# Patient Record
Sex: Female | Born: 1935 | Race: White | Hispanic: No | Marital: Married | Smoking: Never smoker
Health system: Southern US, Community
[De-identification: ages and names within clinical notes are randomized; demographics above are authoritative.]

## PROBLEM LIST (undated history)

## (undated) DIAGNOSIS — E785 Hyperlipidemia, unspecified: Secondary | ICD-10-CM

## (undated) DIAGNOSIS — E079 Disorder of thyroid, unspecified: Secondary | ICD-10-CM

## (undated) DIAGNOSIS — I10 Essential (primary) hypertension: Secondary | ICD-10-CM

## (undated) HISTORY — DX: Disorder of thyroid, unspecified: E07.9

## (undated) HISTORY — DX: Essential (primary) hypertension: I10

## (undated) HISTORY — DX: Hyperlipidemia, unspecified: E78.5

---

## 2005-10-15 ENCOUNTER — Other Ambulatory Visit: Admission: RE | Admit: 2005-10-15 | Discharge: 2005-10-15 | Payer: Self-pay | Admitting: Family Medicine

## 2006-04-18 ENCOUNTER — Ambulatory Visit: Payer: Self-pay | Admitting: Internal Medicine

## 2006-05-22 ENCOUNTER — Encounter (INDEPENDENT_AMBULATORY_CARE_PROVIDER_SITE_OTHER): Payer: Self-pay | Admitting: Specialist

## 2006-05-22 ENCOUNTER — Ambulatory Visit: Payer: Self-pay | Admitting: Internal Medicine

## 2006-08-11 ENCOUNTER — Ambulatory Visit: Payer: Self-pay | Admitting: Internal Medicine

## 2010-11-15 ENCOUNTER — Other Ambulatory Visit
Admission: RE | Admit: 2010-11-15 | Discharge: 2010-11-15 | Payer: Self-pay | Source: Home / Self Care | Admitting: Geriatric Medicine

## 2010-12-12 ENCOUNTER — Encounter
Admission: RE | Admit: 2010-12-12 | Discharge: 2010-12-12 | Payer: Self-pay | Source: Home / Self Care | Attending: Endocrinology | Admitting: Endocrinology

## 2010-12-23 ENCOUNTER — Encounter: Payer: Self-pay | Admitting: Endocrinology

## 2011-04-19 NOTE — Assessment & Plan Note (Signed)
[facility]                           GASTROENTEROLOGY OFFICE NOTE   NAME:ETCHEVERRYFraida, [name]                     MRN:          [id number]  DATE:08/11/2006                            DOB:          11-Feb-1932    REASON FOR CONSULTATION:  Rectal bleeding.   HISTORY:  This is a 77 year old female who was evaluated in this office  initially Apr 18, 2006 for reflux disease, history of adenomatous colon  polyps, and to establish GI care.  See that dictation for details.  She  presents today with minor rectal bleeding.  About 4 weeks ago, she noticed  some bright red blood on the tissue after defecating.  She has also noticed  a drop or 2 in the commode.  There has been no associated abdominal pain.  No associated rectal pain.  She does tell me that she has a history of  hemorrhoids.  She has had multiple colonoscopies; the last of which was 2005  and was essentially normal.  She is otherwise well but just wished to have  this problem evaluated.  She has had no bleeding in several weeks.   MEDICATIONS:  Per medication list, unchanged from previous.   PHYSICAL EXAMINATION:  GENERAL:  Well-appearing female in no acute distress.  VITAL SIGNS:  Blood pressure 140/88.  Heart rate is 88, regular.  Weight is  173.6 pounds (increased 2 pounds).  ABDOMEN:  Obese and soft without tenderness, mass, or hernia.  RECTAL:  Reveals no external abnormalities. No internal tenderness or mass.  Stool is Hemoccult negative.  Anoscopy was performed and revealed internal  hemorrhoids.  No other problems.   IMPRESSION:  1. Prior problems with minor rectal bleeding due to internal hemorrhoids.  2. History of colon polyps with multiple surveillance colonoscopies, last      of which in 2005 was negative.  3. History of reflux disease, asymptomatic, on omeprazole.   RECOMMENDATION:  At this point, the patient has been reassured.  I do not  think she needs any further  investigations at present.  I did give her a  brochure on hemorrhoids and suggested that if she has further problems to  consider fiber therapy.  She was satisfied but will return to the general  medical care of Dr. Smith Mince but knows to contact this office for questions  or problems.                                  Wilhemina Bonito. Shelley [name]., MD   JNP/MedQ  DD:  08/11/2006  DT:  08/11/2006  Job #:  [id number]   cc:   Talmadge Coventry, M.D.

## 2011-12-18 DIAGNOSIS — Z79899 Other long term (current) drug therapy: Secondary | ICD-10-CM | POA: Diagnosis not present

## 2011-12-18 DIAGNOSIS — E782 Mixed hyperlipidemia: Secondary | ICD-10-CM | POA: Diagnosis not present

## 2011-12-18 DIAGNOSIS — E039 Hypothyroidism, unspecified: Secondary | ICD-10-CM | POA: Diagnosis not present

## 2011-12-18 DIAGNOSIS — D7589 Other specified diseases of blood and blood-forming organs: Secondary | ICD-10-CM | POA: Diagnosis not present

## 2011-12-18 DIAGNOSIS — K219 Gastro-esophageal reflux disease without esophagitis: Secondary | ICD-10-CM | POA: Diagnosis not present

## 2011-12-18 DIAGNOSIS — I10 Essential (primary) hypertension: Secondary | ICD-10-CM | POA: Diagnosis not present

## 2012-01-06 DIAGNOSIS — J40 Bronchitis, not specified as acute or chronic: Secondary | ICD-10-CM | POA: Diagnosis not present

## 2012-03-24 DIAGNOSIS — R197 Diarrhea, unspecified: Secondary | ICD-10-CM | POA: Diagnosis not present

## 2012-04-15 DIAGNOSIS — I10 Essential (primary) hypertension: Secondary | ICD-10-CM | POA: Diagnosis not present

## 2012-04-15 DIAGNOSIS — N898 Other specified noninflammatory disorders of vagina: Secondary | ICD-10-CM | POA: Diagnosis not present

## 2012-04-15 DIAGNOSIS — M899 Disorder of bone, unspecified: Secondary | ICD-10-CM | POA: Diagnosis not present

## 2012-04-15 DIAGNOSIS — N649 Disorder of breast, unspecified: Secondary | ICD-10-CM | POA: Diagnosis not present

## 2012-06-03 DIAGNOSIS — M899 Disorder of bone, unspecified: Secondary | ICD-10-CM | POA: Diagnosis not present

## 2012-06-03 DIAGNOSIS — M949 Disorder of cartilage, unspecified: Secondary | ICD-10-CM | POA: Diagnosis not present

## 2012-07-28 DIAGNOSIS — Z1231 Encounter for screening mammogram for malignant neoplasm of breast: Secondary | ICD-10-CM | POA: Diagnosis not present

## 2012-07-29 DIAGNOSIS — Z1231 Encounter for screening mammogram for malignant neoplasm of breast: Secondary | ICD-10-CM | POA: Diagnosis not present

## 2012-07-29 DIAGNOSIS — N6489 Other specified disorders of breast: Secondary | ICD-10-CM | POA: Diagnosis not present

## 2012-08-05 DIAGNOSIS — H2510 Age-related nuclear cataract, unspecified eye: Secondary | ICD-10-CM | POA: Diagnosis not present

## 2012-08-26 DIAGNOSIS — I129 Hypertensive chronic kidney disease with stage 1 through stage 4 chronic kidney disease, or unspecified chronic kidney disease: Secondary | ICD-10-CM | POA: Diagnosis not present

## 2012-08-26 DIAGNOSIS — I10 Essential (primary) hypertension: Secondary | ICD-10-CM | POA: Diagnosis not present

## 2012-08-26 DIAGNOSIS — Z23 Encounter for immunization: Secondary | ICD-10-CM | POA: Diagnosis not present

## 2012-08-26 DIAGNOSIS — Z79899 Other long term (current) drug therapy: Secondary | ICD-10-CM | POA: Diagnosis not present

## 2012-08-26 DIAGNOSIS — E782 Mixed hyperlipidemia: Secondary | ICD-10-CM | POA: Diagnosis not present

## 2012-09-16 DIAGNOSIS — L821 Other seborrheic keratosis: Secondary | ICD-10-CM | POA: Diagnosis not present

## 2012-09-16 DIAGNOSIS — L570 Actinic keratosis: Secondary | ICD-10-CM | POA: Diagnosis not present

## 2012-09-16 DIAGNOSIS — Z85828 Personal history of other malignant neoplasm of skin: Secondary | ICD-10-CM | POA: Diagnosis not present

## 2012-09-21 DIAGNOSIS — I10 Essential (primary) hypertension: Secondary | ICD-10-CM | POA: Diagnosis not present

## 2012-09-28 DIAGNOSIS — I129 Hypertensive chronic kidney disease with stage 1 through stage 4 chronic kidney disease, or unspecified chronic kidney disease: Secondary | ICD-10-CM | POA: Diagnosis not present

## 2012-10-21 DIAGNOSIS — I10 Essential (primary) hypertension: Secondary | ICD-10-CM | POA: Diagnosis not present

## 2012-10-21 DIAGNOSIS — E782 Mixed hyperlipidemia: Secondary | ICD-10-CM | POA: Diagnosis not present

## 2012-11-03 DIAGNOSIS — E040 Nontoxic diffuse goiter: Secondary | ICD-10-CM | POA: Diagnosis not present

## 2012-11-03 DIAGNOSIS — E039 Hypothyroidism, unspecified: Secondary | ICD-10-CM | POA: Diagnosis not present

## 2012-12-29 DIAGNOSIS — Z0000 Encounter for general adult medical examination without abnormal findings: Secondary | ICD-10-CM | POA: Diagnosis not present

## 2012-12-29 DIAGNOSIS — I129 Hypertensive chronic kidney disease with stage 1 through stage 4 chronic kidney disease, or unspecified chronic kidney disease: Secondary | ICD-10-CM | POA: Diagnosis not present

## 2012-12-29 DIAGNOSIS — Z1331 Encounter for screening for depression: Secondary | ICD-10-CM | POA: Diagnosis not present

## 2012-12-29 DIAGNOSIS — Z79899 Other long term (current) drug therapy: Secondary | ICD-10-CM | POA: Diagnosis not present

## 2013-02-04 ENCOUNTER — Other Ambulatory Visit: Payer: Self-pay | Admitting: Endocrinology

## 2013-02-04 DIAGNOSIS — E049 Nontoxic goiter, unspecified: Secondary | ICD-10-CM

## 2013-03-30 DIAGNOSIS — I10 Essential (primary) hypertension: Secondary | ICD-10-CM | POA: Diagnosis not present

## 2013-03-30 DIAGNOSIS — E782 Mixed hyperlipidemia: Secondary | ICD-10-CM | POA: Diagnosis not present

## 2013-04-28 DIAGNOSIS — N898 Other specified noninflammatory disorders of vagina: Secondary | ICD-10-CM | POA: Diagnosis not present

## 2013-04-28 DIAGNOSIS — Z01419 Encounter for gynecological examination (general) (routine) without abnormal findings: Secondary | ICD-10-CM | POA: Diagnosis not present

## 2013-06-04 DIAGNOSIS — W57XXXA Bitten or stung by nonvenomous insect and other nonvenomous arthropods, initial encounter: Secondary | ICD-10-CM | POA: Diagnosis not present

## 2013-07-07 DIAGNOSIS — M25579 Pain in unspecified ankle and joints of unspecified foot: Secondary | ICD-10-CM | POA: Diagnosis not present

## 2013-07-07 DIAGNOSIS — M1710 Unilateral primary osteoarthritis, unspecified knee: Secondary | ICD-10-CM | POA: Diagnosis not present

## 2013-07-15 DIAGNOSIS — M1710 Unilateral primary osteoarthritis, unspecified knee: Secondary | ICD-10-CM | POA: Diagnosis not present

## 2013-07-29 DIAGNOSIS — Z803 Family history of malignant neoplasm of breast: Secondary | ICD-10-CM | POA: Diagnosis not present

## 2013-07-29 DIAGNOSIS — Z1231 Encounter for screening mammogram for malignant neoplasm of breast: Secondary | ICD-10-CM | POA: Diagnosis not present

## 2013-08-17 DIAGNOSIS — H02829 Cysts of unspecified eye, unspecified eyelid: Secondary | ICD-10-CM | POA: Diagnosis not present

## 2013-08-17 DIAGNOSIS — H2510 Age-related nuclear cataract, unspecified eye: Secondary | ICD-10-CM | POA: Diagnosis not present

## 2013-09-17 DIAGNOSIS — Z23 Encounter for immunization: Secondary | ICD-10-CM | POA: Diagnosis not present

## 2013-09-28 DIAGNOSIS — I129 Hypertensive chronic kidney disease with stage 1 through stage 4 chronic kidney disease, or unspecified chronic kidney disease: Secondary | ICD-10-CM | POA: Diagnosis not present

## 2013-09-28 DIAGNOSIS — E782 Mixed hyperlipidemia: Secondary | ICD-10-CM | POA: Diagnosis not present

## 2013-09-28 DIAGNOSIS — Z79899 Other long term (current) drug therapy: Secondary | ICD-10-CM | POA: Diagnosis not present

## 2013-09-28 DIAGNOSIS — E039 Hypothyroidism, unspecified: Secondary | ICD-10-CM | POA: Diagnosis not present

## 2013-09-28 DIAGNOSIS — Z1331 Encounter for screening for depression: Secondary | ICD-10-CM | POA: Diagnosis not present

## 2013-10-25 ENCOUNTER — Ambulatory Visit
Admission: RE | Admit: 2013-10-25 | Discharge: 2013-10-25 | Disposition: A | Discharge status: Home / Self Care | Payer: Medicare Other | Source: Ambulatory Visit | Attending: Endocrinology | Admitting: Endocrinology

## 2013-10-25 DIAGNOSIS — E049 Nontoxic goiter, unspecified: Secondary | ICD-10-CM

## 2014-05-06 IMAGING — US US SOFT TISSUE HEAD/NECK
1 series · 14 of 25 positions shown · non-contrast
Comparison: 12/12/2010 and earlier studies

CLINICAL DATA: Goiter

EXAM:
THYROID ULTRASOUND
TECHNIQUE: Ultrasound examination of the thyroid gland and adjacent soft
tissues was performed.

[Series 1: us soft tissue head/neck · 0.06mm/px · 14 of 43 slices shown]
[im 1/43]
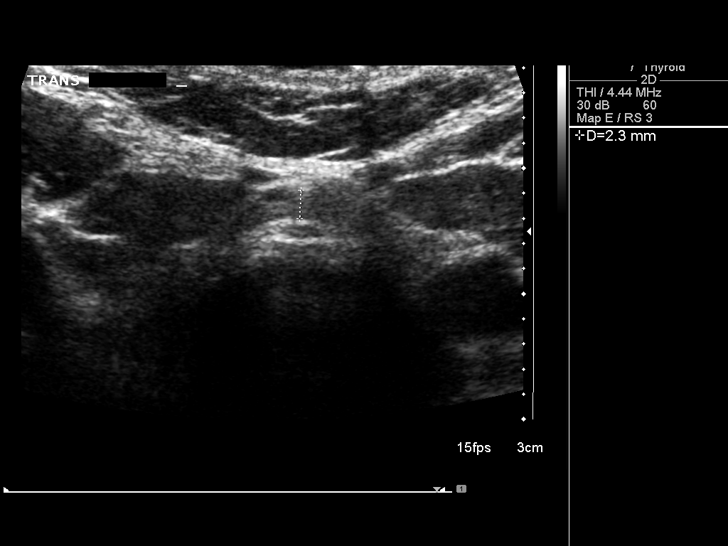
[im 4/43]
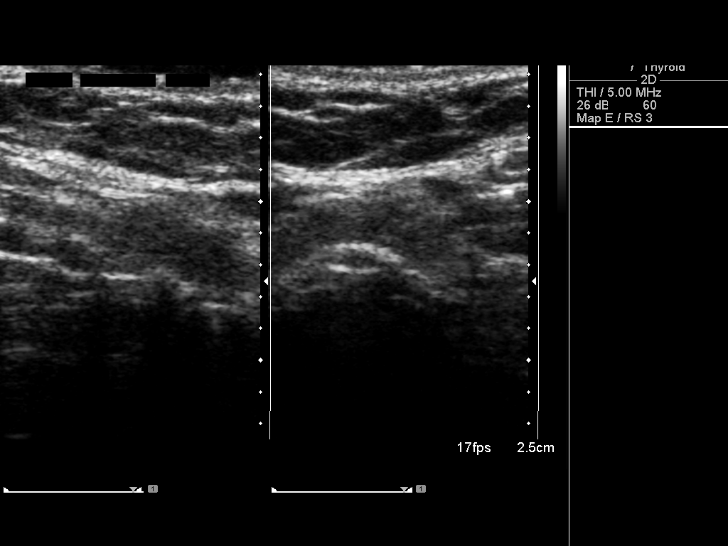
[im 8/43]
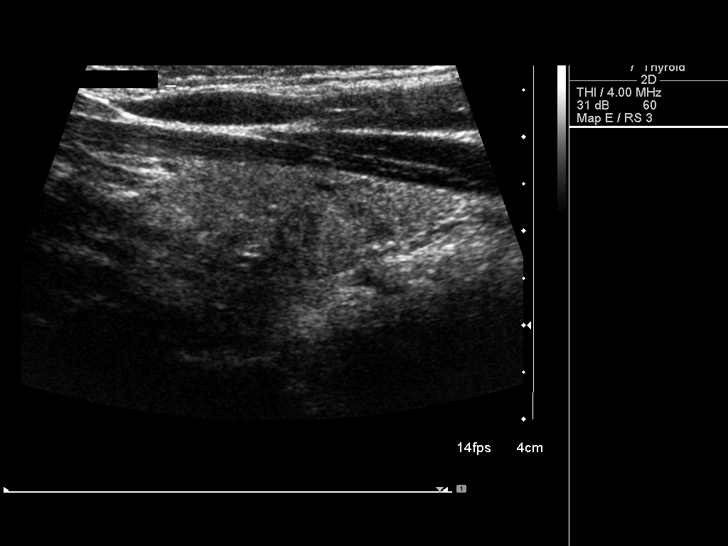
[im 11/43]
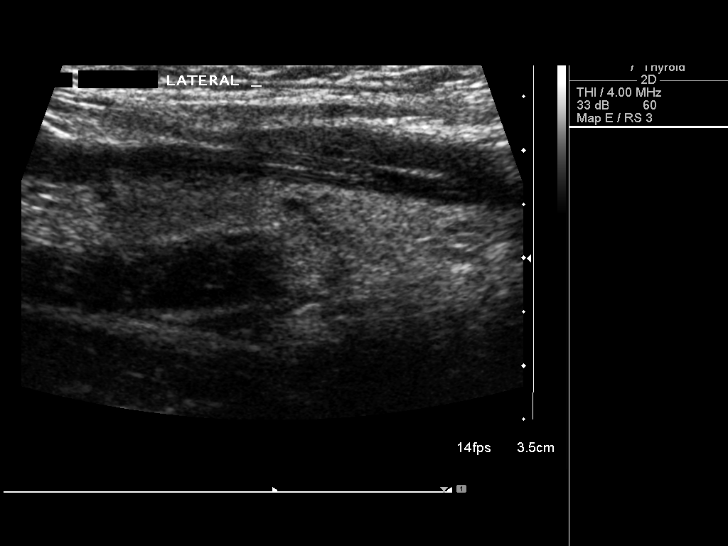
[im 15/43]
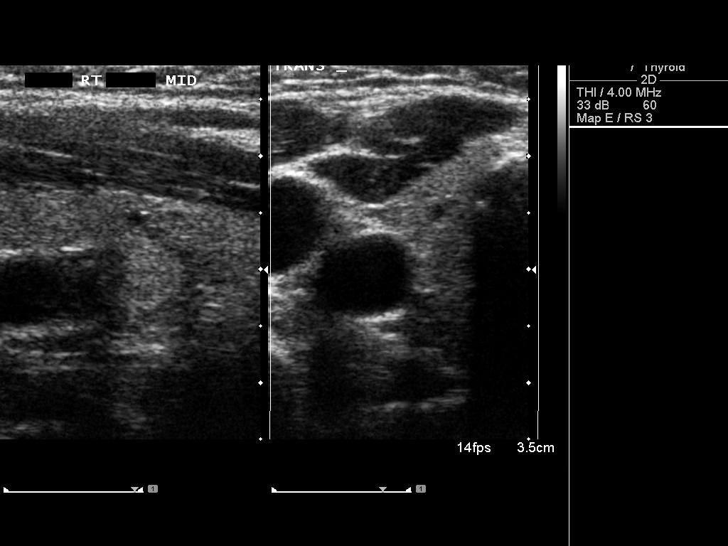
[im 16/43]
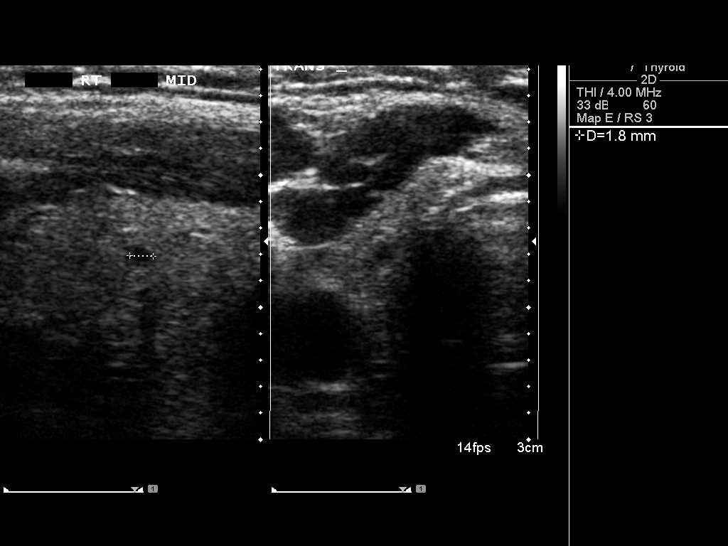
[im 20/43]
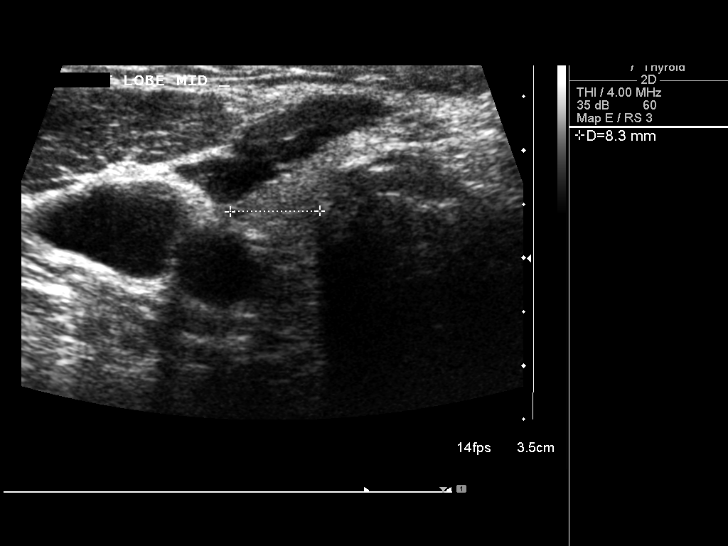
[im 23/43]
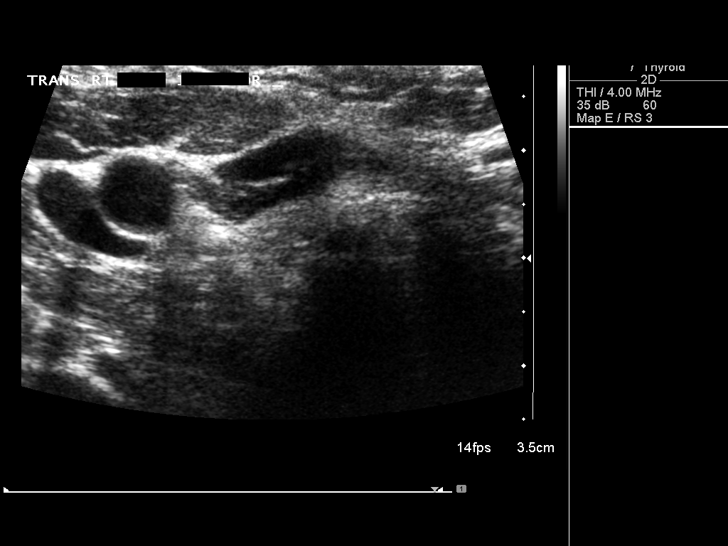
[im 27/43]
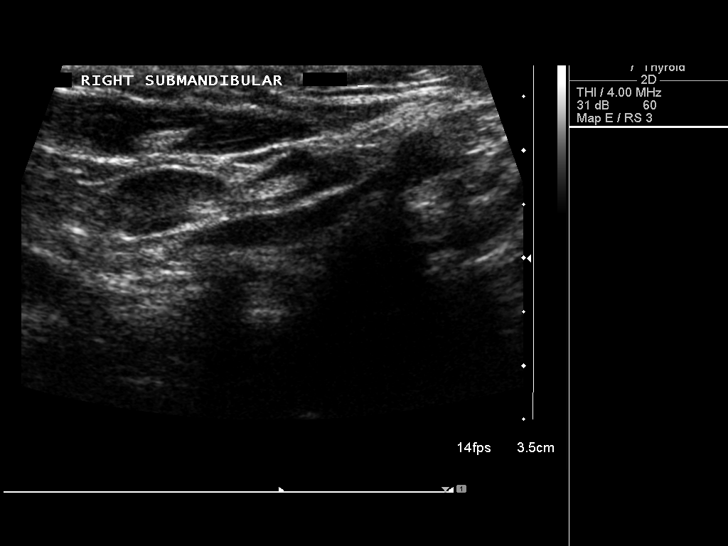
[im 29/43]
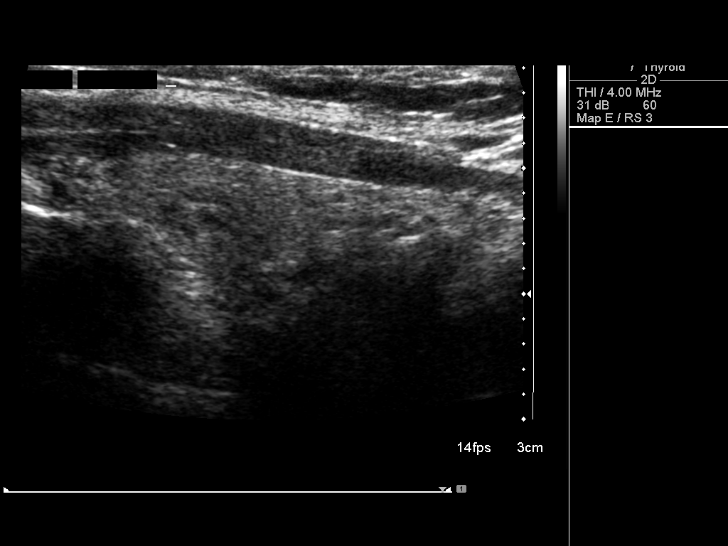
[im 32/43]
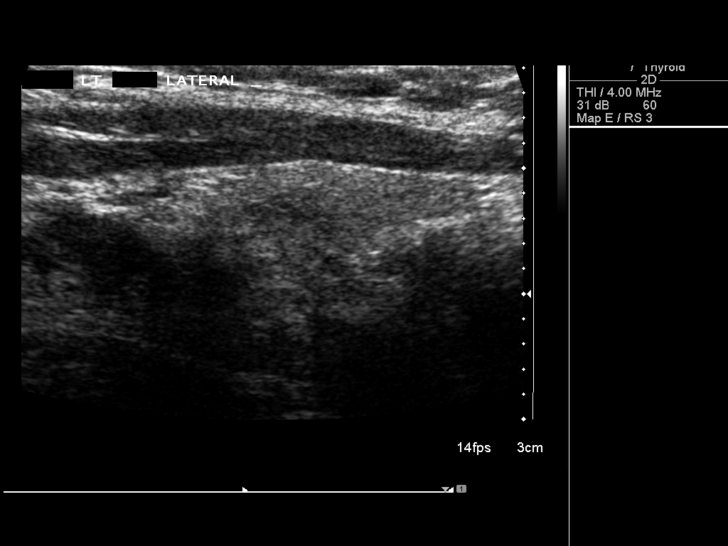
[im 36/43]
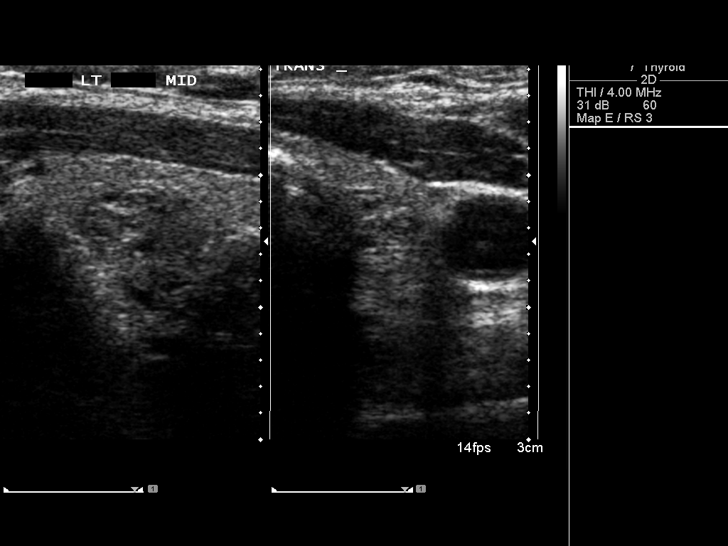
[im 39/43]
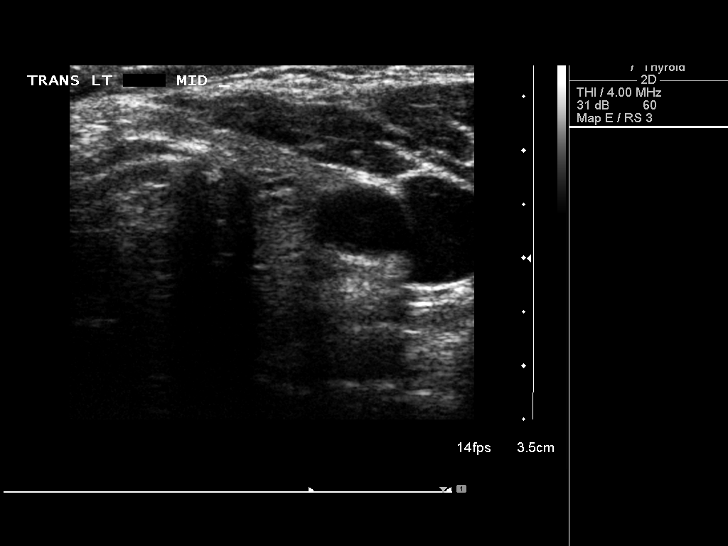
[im 43/43]
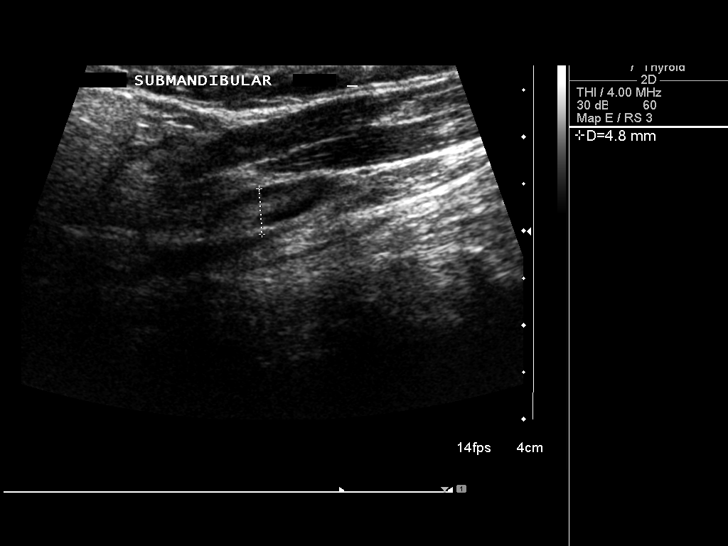

[14 of 25 positions shown; findings below may reference images not displayed]

FINDINGS: Right thyroid lobe

Measurements: 34 x 13 x 8 mm. Inhomogeneous with 2 small cystic
areas measuring 2 mm or less.

Left thyroid lobe

Measurements: 28 x 12 x 9 mm.  Inhomogeneous and hyperemic.

7 x 6 x 6 mm solid nodule, mid lobe

Isthmus

Thickness: 2 mm. Poorly marginated 8 x 4 x 5 mm hypoechoic nodule
left of midline.

Lymphadenopathy

None visualized.
IMPRESSION: Normal-sized thyroid with small nodules as above. Findings do not
meet current consensus criteria for biopsy. Follow-up by clinical
exam is recommended. If patient has known risk factors for thyroid
carcinoma, consider follow-up ultrasound in 12 months. If patient is
clinically hyperthyroid, consider nuclear medicine thyroid uptake
and scan. This recommendation follows the consensus statement:
Management of Thyroid Nodules Detected as US: Society of
Radiologists in Ultrasound Consensus Conference Statement. Radiology

## 2014-05-10 DIAGNOSIS — N898 Other specified noninflammatory disorders of vagina: Secondary | ICD-10-CM | POA: Diagnosis not present

## 2014-05-24 DIAGNOSIS — E040 Nontoxic diffuse goiter: Secondary | ICD-10-CM | POA: Diagnosis not present

## 2014-05-24 DIAGNOSIS — E039 Hypothyroidism, unspecified: Secondary | ICD-10-CM | POA: Diagnosis not present

## 2014-07-13 DIAGNOSIS — D485 Neoplasm of uncertain behavior of skin: Secondary | ICD-10-CM | POA: Diagnosis not present

## 2014-07-22 DIAGNOSIS — Z79899 Other long term (current) drug therapy: Secondary | ICD-10-CM | POA: Diagnosis not present

## 2014-07-22 DIAGNOSIS — Z1331 Encounter for screening for depression: Secondary | ICD-10-CM | POA: Diagnosis not present

## 2014-07-22 DIAGNOSIS — N183 Chronic kidney disease, stage 3 unspecified: Secondary | ICD-10-CM | POA: Diagnosis not present

## 2014-07-22 DIAGNOSIS — I129 Hypertensive chronic kidney disease with stage 1 through stage 4 chronic kidney disease, or unspecified chronic kidney disease: Secondary | ICD-10-CM | POA: Diagnosis not present

## 2014-07-22 DIAGNOSIS — E782 Mixed hyperlipidemia: Secondary | ICD-10-CM | POA: Diagnosis not present

## 2014-07-22 DIAGNOSIS — Z0000 Encounter for general adult medical examination without abnormal findings: Secondary | ICD-10-CM | POA: Diagnosis not present

## 2014-07-22 DIAGNOSIS — Z23 Encounter for immunization: Secondary | ICD-10-CM | POA: Diagnosis not present

## 2014-08-04 DIAGNOSIS — Z803 Family history of malignant neoplasm of breast: Secondary | ICD-10-CM | POA: Diagnosis not present

## 2014-08-04 DIAGNOSIS — Z1231 Encounter for screening mammogram for malignant neoplasm of breast: Secondary | ICD-10-CM | POA: Diagnosis not present

## 2014-08-15 DIAGNOSIS — Z23 Encounter for immunization: Secondary | ICD-10-CM | POA: Diagnosis not present

## 2014-08-15 DIAGNOSIS — M79609 Pain in unspecified limb: Secondary | ICD-10-CM | POA: Diagnosis not present

## 2014-08-15 DIAGNOSIS — I10 Essential (primary) hypertension: Secondary | ICD-10-CM | POA: Diagnosis not present

## 2014-08-16 ENCOUNTER — Other Ambulatory Visit: Payer: Self-pay | Admitting: Geriatric Medicine

## 2014-08-16 ENCOUNTER — Ambulatory Visit
Admission: RE | Admit: 2014-08-16 | Discharge: 2014-08-16 | Disposition: A | Discharge status: Home / Self Care | Payer: Medicare Other | Source: Ambulatory Visit | Attending: Geriatric Medicine | Admitting: Geriatric Medicine

## 2014-08-16 DIAGNOSIS — M79645 Pain in left finger(s): Secondary | ICD-10-CM

## 2014-08-16 DIAGNOSIS — M19049 Primary osteoarthritis, unspecified hand: Secondary | ICD-10-CM | POA: Diagnosis not present

## 2014-11-10 DIAGNOSIS — M8580 Other specified disorders of bone density and structure, unspecified site: Secondary | ICD-10-CM | POA: Diagnosis not present

## 2014-11-10 DIAGNOSIS — M899 Disorder of bone, unspecified: Secondary | ICD-10-CM | POA: Diagnosis not present

## 2015-01-02 DIAGNOSIS — H2513 Age-related nuclear cataract, bilateral: Secondary | ICD-10-CM | POA: Diagnosis not present

## 2015-01-05 DIAGNOSIS — R002 Palpitations: Secondary | ICD-10-CM | POA: Diagnosis not present

## 2015-01-05 DIAGNOSIS — N183 Chronic kidney disease, stage 3 (moderate): Secondary | ICD-10-CM | POA: Diagnosis not present

## 2015-01-05 DIAGNOSIS — N8110 Cystocele, unspecified: Secondary | ICD-10-CM | POA: Diagnosis not present

## 2015-01-05 DIAGNOSIS — I129 Hypertensive chronic kidney disease with stage 1 through stage 4 chronic kidney disease, or unspecified chronic kidney disease: Secondary | ICD-10-CM | POA: Diagnosis not present

## 2015-01-10 DIAGNOSIS — N8111 Cystocele, midline: Secondary | ICD-10-CM | POA: Diagnosis not present

## 2015-01-10 DIAGNOSIS — N816 Rectocele: Secondary | ICD-10-CM | POA: Diagnosis not present

## 2015-02-25 IMAGING — CR DG FINGER THUMB 2+V*L*
3 series · 3 of 3 positions shown · non-contrast
Comparison: None.

CLINICAL DATA: Six week history of pain and swelling involving the
left thumb, localizing to the 1st MCP joint.

EXAM:
LEFT THUMB 2+V

[view not recorded (1 of 3)]
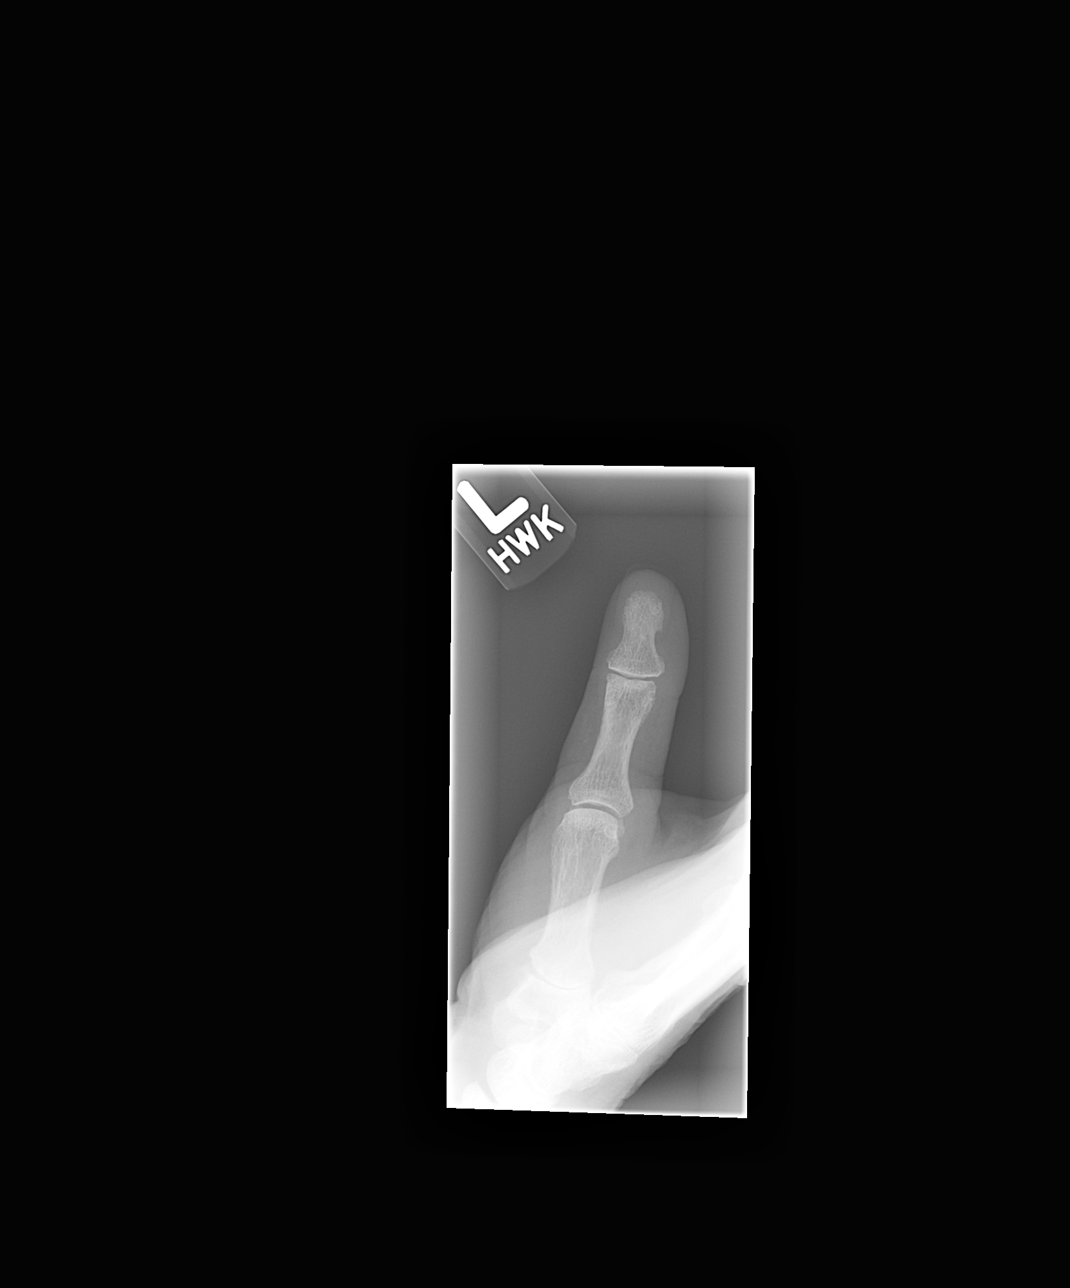

[view not recorded (2 of 3)]
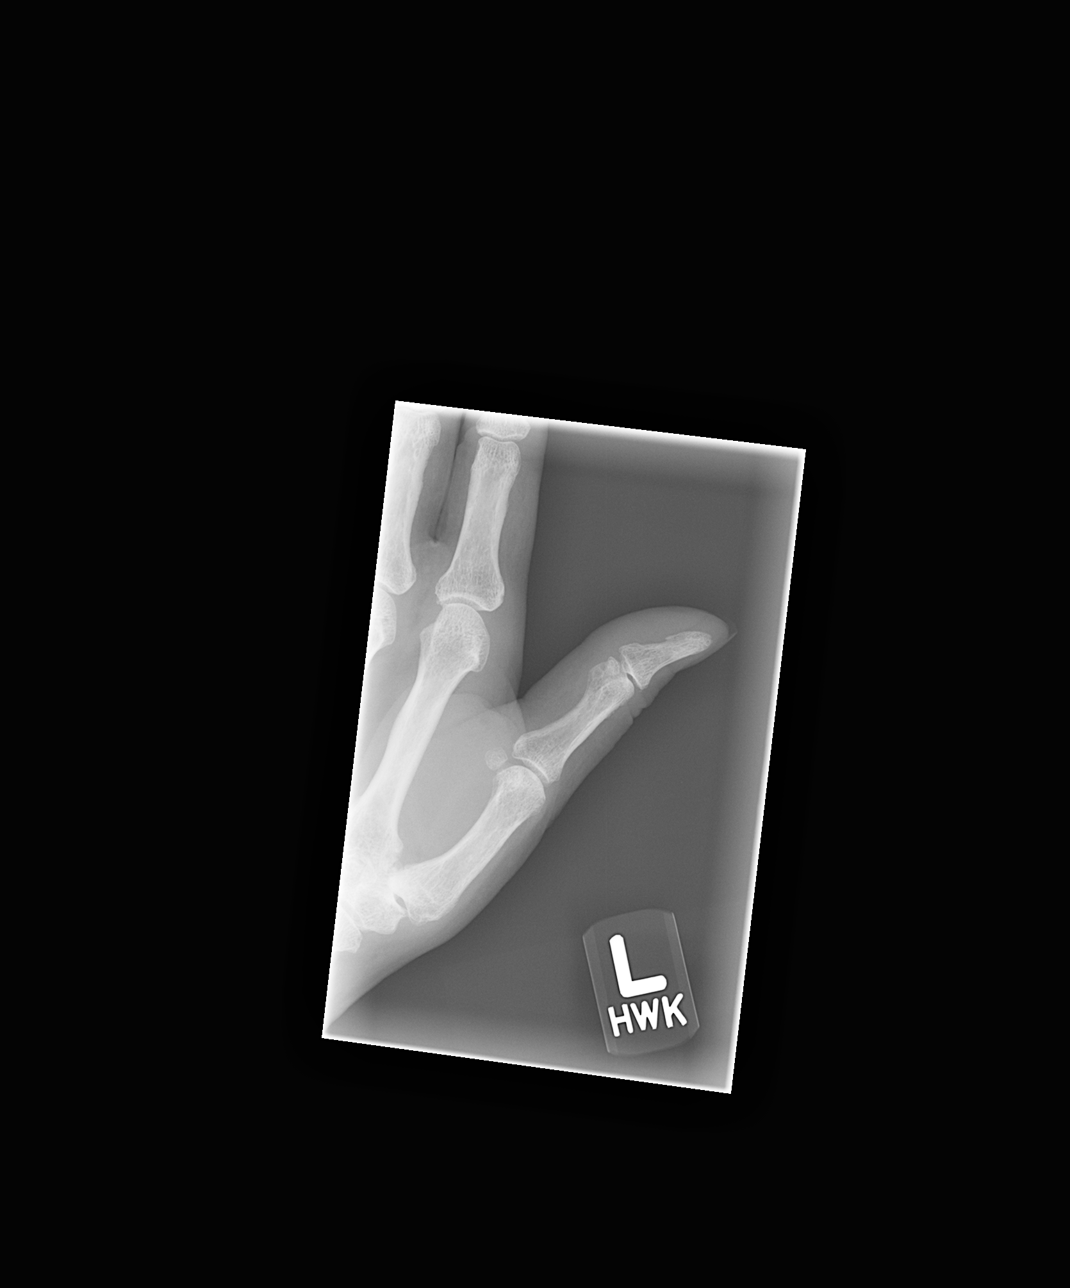

[view not recorded (3 of 3)]
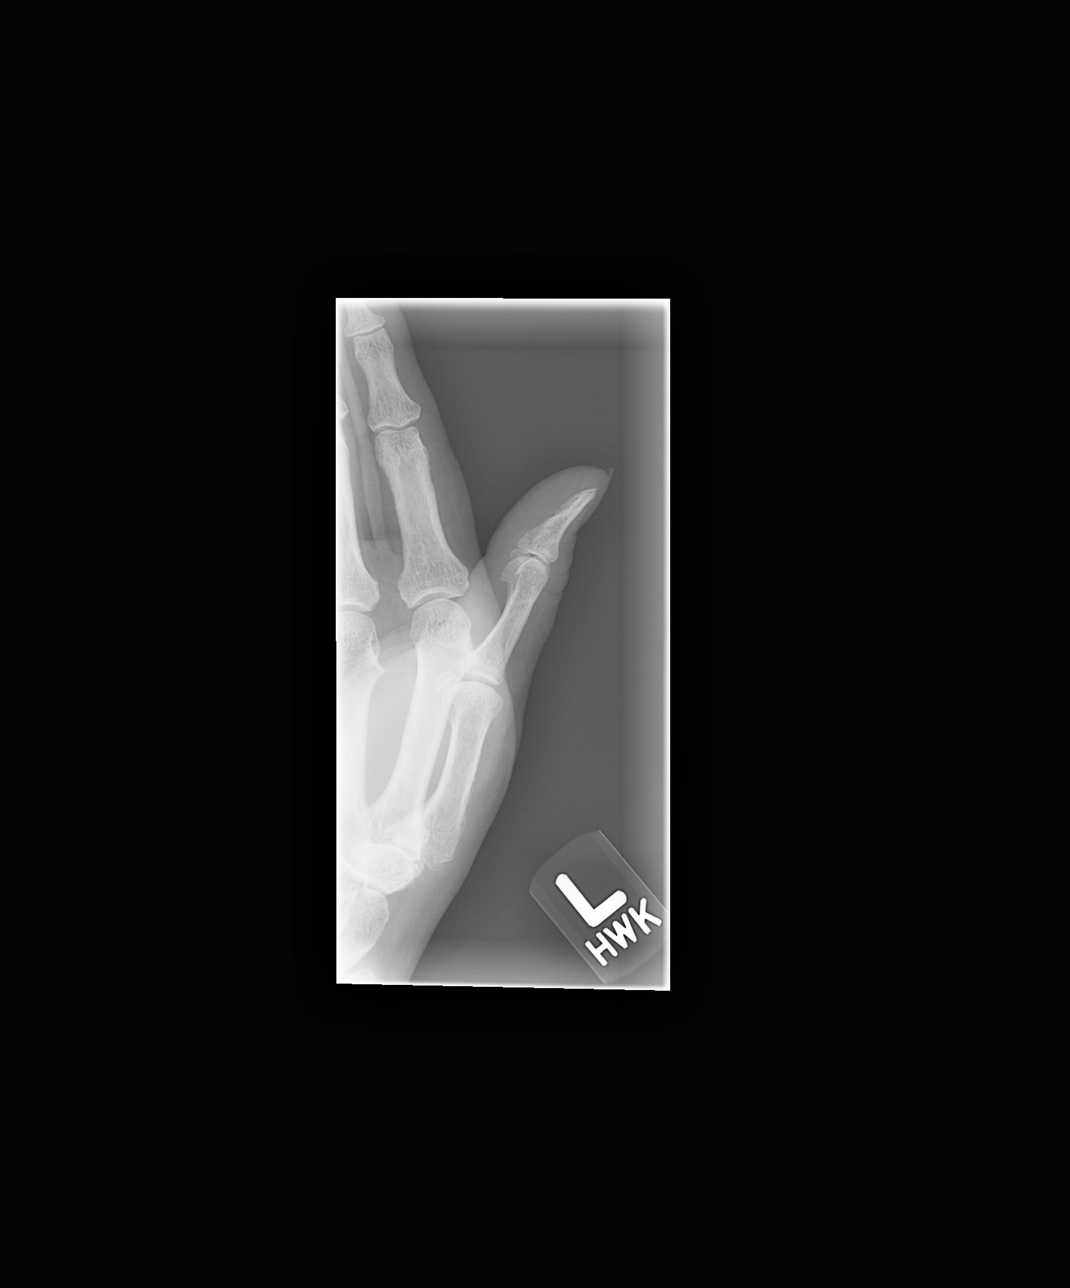

[3 of 3 positions shown; findings below may reference images not displayed]

FINDINGS: No evidence of acute or subacute fracture or dislocation. Mild
narrowing of the DIP joint space. PIP joint space well preserved.
Marked narrowing of the trapezium-9st metacarpal joint with
associated hypertrophic spurring.
IMPRESSION: No acute or subacute osseous abnormality. Osteoarthritis involving
the 1st MCP joint and to a lesser degree the PIP joint.

## 2015-04-04 DIAGNOSIS — H1013 Acute atopic conjunctivitis, bilateral: Secondary | ICD-10-CM | POA: Diagnosis not present

## 2015-04-17 DIAGNOSIS — H1013 Acute atopic conjunctivitis, bilateral: Secondary | ICD-10-CM | POA: Diagnosis not present

## 2015-04-28 ENCOUNTER — Other Ambulatory Visit: Payer: Self-pay | Admitting: Physician Assistant

## 2015-04-28 DIAGNOSIS — L820 Inflamed seborrheic keratosis: Secondary | ICD-10-CM | POA: Diagnosis not present

## 2015-05-24 DIAGNOSIS — Z01411 Encounter for gynecological examination (general) (routine) with abnormal findings: Secondary | ICD-10-CM | POA: Diagnosis not present

## 2015-05-24 DIAGNOSIS — N816 Rectocele: Secondary | ICD-10-CM | POA: Diagnosis not present

## 2015-05-24 DIAGNOSIS — N8111 Cystocele, midline: Secondary | ICD-10-CM | POA: Diagnosis not present

## 2015-06-06 ENCOUNTER — Other Ambulatory Visit: Payer: Self-pay | Admitting: Endocrinology

## 2015-06-06 DIAGNOSIS — E039 Hypothyroidism, unspecified: Secondary | ICD-10-CM | POA: Diagnosis not present

## 2015-06-06 DIAGNOSIS — E049 Nontoxic goiter, unspecified: Secondary | ICD-10-CM

## 2015-07-20 DIAGNOSIS — I129 Hypertensive chronic kidney disease with stage 1 through stage 4 chronic kidney disease, or unspecified chronic kidney disease: Secondary | ICD-10-CM | POA: Diagnosis not present

## 2015-07-20 DIAGNOSIS — E042 Nontoxic multinodular goiter: Secondary | ICD-10-CM | POA: Diagnosis not present

## 2015-07-20 DIAGNOSIS — D7589 Other specified diseases of blood and blood-forming organs: Secondary | ICD-10-CM | POA: Diagnosis not present

## 2015-07-20 DIAGNOSIS — N183 Chronic kidney disease, stage 3 (moderate): Secondary | ICD-10-CM | POA: Diagnosis not present

## 2015-07-20 DIAGNOSIS — E780 Pure hypercholesterolemia: Secondary | ICD-10-CM | POA: Diagnosis not present

## 2015-07-20 DIAGNOSIS — Z6831 Body mass index (BMI) 31.0-31.9, adult: Secondary | ICD-10-CM | POA: Diagnosis not present

## 2015-07-20 DIAGNOSIS — Z0000 Encounter for general adult medical examination without abnormal findings: Secondary | ICD-10-CM | POA: Diagnosis not present

## 2015-07-20 DIAGNOSIS — E669 Obesity, unspecified: Secondary | ICD-10-CM | POA: Diagnosis not present

## 2015-07-20 DIAGNOSIS — Z1389 Encounter for screening for other disorder: Secondary | ICD-10-CM | POA: Diagnosis not present

## 2015-07-20 DIAGNOSIS — Z79899 Other long term (current) drug therapy: Secondary | ICD-10-CM | POA: Diagnosis not present

## 2015-07-20 DIAGNOSIS — E039 Hypothyroidism, unspecified: Secondary | ICD-10-CM | POA: Diagnosis not present

## 2015-07-20 DIAGNOSIS — K219 Gastro-esophageal reflux disease without esophagitis: Secondary | ICD-10-CM | POA: Diagnosis not present

## 2015-08-10 DIAGNOSIS — Z1231 Encounter for screening mammogram for malignant neoplasm of breast: Secondary | ICD-10-CM | POA: Diagnosis not present

## 2015-08-16 DIAGNOSIS — R928 Other abnormal and inconclusive findings on diagnostic imaging of breast: Secondary | ICD-10-CM | POA: Diagnosis not present

## 2015-08-19 DIAGNOSIS — Z23 Encounter for immunization: Secondary | ICD-10-CM | POA: Diagnosis not present

## 2015-10-16 DIAGNOSIS — L812 Freckles: Secondary | ICD-10-CM | POA: Diagnosis not present

## 2015-10-16 DIAGNOSIS — L820 Inflamed seborrheic keratosis: Secondary | ICD-10-CM | POA: Diagnosis not present

## 2015-10-16 DIAGNOSIS — L821 Other seborrheic keratosis: Secondary | ICD-10-CM | POA: Diagnosis not present

## 2015-10-16 DIAGNOSIS — L570 Actinic keratosis: Secondary | ICD-10-CM | POA: Diagnosis not present

## 2015-10-16 DIAGNOSIS — D225 Melanocytic nevi of trunk: Secondary | ICD-10-CM | POA: Diagnosis not present

## 2015-10-16 DIAGNOSIS — L309 Dermatitis, unspecified: Secondary | ICD-10-CM | POA: Diagnosis not present

## 2016-01-17 DIAGNOSIS — D485 Neoplasm of uncertain behavior of skin: Secondary | ICD-10-CM | POA: Diagnosis not present

## 2016-01-17 DIAGNOSIS — D225 Melanocytic nevi of trunk: Secondary | ICD-10-CM | POA: Diagnosis not present

## 2016-01-17 DIAGNOSIS — L821 Other seborrheic keratosis: Secondary | ICD-10-CM | POA: Diagnosis not present

## 2016-01-17 DIAGNOSIS — D2372 Other benign neoplasm of skin of left lower limb, including hip: Secondary | ICD-10-CM | POA: Diagnosis not present

## 2016-01-23 DIAGNOSIS — E7800 Pure hypercholesterolemia, unspecified: Secondary | ICD-10-CM | POA: Diagnosis not present

## 2016-01-23 DIAGNOSIS — N183 Chronic kidney disease, stage 3 (moderate): Secondary | ICD-10-CM | POA: Diagnosis not present

## 2016-01-23 DIAGNOSIS — I129 Hypertensive chronic kidney disease with stage 1 through stage 4 chronic kidney disease, or unspecified chronic kidney disease: Secondary | ICD-10-CM | POA: Diagnosis not present

## 2016-01-23 DIAGNOSIS — R928 Other abnormal and inconclusive findings on diagnostic imaging of breast: Secondary | ICD-10-CM | POA: Diagnosis not present

## 2016-01-24 DIAGNOSIS — H2513 Age-related nuclear cataract, bilateral: Secondary | ICD-10-CM | POA: Diagnosis not present

## 2016-02-13 DIAGNOSIS — N6489 Other specified disorders of breast: Secondary | ICD-10-CM | POA: Diagnosis not present

## 2016-05-10 DIAGNOSIS — J020 Streptococcal pharyngitis: Secondary | ICD-10-CM | POA: Diagnosis not present

## 2016-05-29 ENCOUNTER — Other Ambulatory Visit: Payer: 59

## 2016-06-05 ENCOUNTER — Other Ambulatory Visit: Payer: 59

## 2016-06-06 ENCOUNTER — Ambulatory Visit
Admission: RE | Admit: 2016-06-06 | Discharge: 2016-06-06 | Disposition: A | Discharge status: Home / Self Care | Payer: Medicare Other | Source: Ambulatory Visit | Attending: Endocrinology | Admitting: Endocrinology

## 2016-06-06 DIAGNOSIS — E049 Nontoxic goiter, unspecified: Secondary | ICD-10-CM

## 2016-06-06 DIAGNOSIS — E042 Nontoxic multinodular goiter: Secondary | ICD-10-CM | POA: Diagnosis not present

## 2016-06-24 DIAGNOSIS — E039 Hypothyroidism, unspecified: Secondary | ICD-10-CM | POA: Diagnosis not present

## 2016-07-01 ENCOUNTER — Other Ambulatory Visit: Payer: Self-pay | Admitting: Endocrinology

## 2016-07-01 DIAGNOSIS — I10 Essential (primary) hypertension: Secondary | ICD-10-CM | POA: Diagnosis not present

## 2016-07-01 DIAGNOSIS — E049 Nontoxic goiter, unspecified: Secondary | ICD-10-CM

## 2016-07-01 DIAGNOSIS — E039 Hypothyroidism, unspecified: Secondary | ICD-10-CM | POA: Diagnosis not present

## 2016-07-23 ENCOUNTER — Ambulatory Visit (HOSPITAL_COMMUNITY): Payer: Medicare Other | Attending: Cardiology

## 2016-07-23 ENCOUNTER — Other Ambulatory Visit: Payer: Self-pay

## 2016-07-23 ENCOUNTER — Other Ambulatory Visit: Payer: Self-pay | Admitting: Geriatric Medicine

## 2016-07-23 ENCOUNTER — Encounter (INDEPENDENT_AMBULATORY_CARE_PROVIDER_SITE_OTHER): Payer: Self-pay

## 2016-07-23 DIAGNOSIS — E042 Nontoxic multinodular goiter: Secondary | ICD-10-CM | POA: Diagnosis not present

## 2016-07-23 DIAGNOSIS — E039 Hypothyroidism, unspecified: Secondary | ICD-10-CM | POA: Diagnosis not present

## 2016-07-23 DIAGNOSIS — I119 Hypertensive heart disease without heart failure: Secondary | ICD-10-CM | POA: Insufficient documentation

## 2016-07-23 DIAGNOSIS — I358 Other nonrheumatic aortic valve disorders: Secondary | ICD-10-CM | POA: Insufficient documentation

## 2016-07-23 DIAGNOSIS — Z79899 Other long term (current) drug therapy: Secondary | ICD-10-CM | POA: Diagnosis not present

## 2016-07-23 DIAGNOSIS — D7589 Other specified diseases of blood and blood-forming organs: Secondary | ICD-10-CM | POA: Diagnosis not present

## 2016-07-23 DIAGNOSIS — I129 Hypertensive chronic kidney disease with stage 1 through stage 4 chronic kidney disease, or unspecified chronic kidney disease: Secondary | ICD-10-CM | POA: Diagnosis not present

## 2016-07-23 DIAGNOSIS — N183 Chronic kidney disease, stage 3 (moderate): Secondary | ICD-10-CM | POA: Diagnosis not present

## 2016-07-23 DIAGNOSIS — I359 Nonrheumatic aortic valve disorder, unspecified: Secondary | ICD-10-CM | POA: Diagnosis present

## 2016-07-23 DIAGNOSIS — Z1389 Encounter for screening for other disorder: Secondary | ICD-10-CM | POA: Diagnosis not present

## 2016-07-23 DIAGNOSIS — M85859 Other specified disorders of bone density and structure, unspecified thigh: Secondary | ICD-10-CM | POA: Diagnosis not present

## 2016-07-23 DIAGNOSIS — E7800 Pure hypercholesterolemia, unspecified: Secondary | ICD-10-CM | POA: Diagnosis not present

## 2016-07-23 DIAGNOSIS — Z0000 Encounter for general adult medical examination without abnormal findings: Secondary | ICD-10-CM | POA: Diagnosis not present

## 2016-07-23 DIAGNOSIS — K219 Gastro-esophageal reflux disease without esophagitis: Secondary | ICD-10-CM | POA: Diagnosis not present

## 2016-08-12 DIAGNOSIS — Z23 Encounter for immunization: Secondary | ICD-10-CM | POA: Diagnosis not present

## 2016-08-20 DIAGNOSIS — Z803 Family history of malignant neoplasm of breast: Secondary | ICD-10-CM | POA: Diagnosis not present

## 2016-08-20 DIAGNOSIS — Z1231 Encounter for screening mammogram for malignant neoplasm of breast: Secondary | ICD-10-CM | POA: Diagnosis not present

## 2016-09-02 DIAGNOSIS — E039 Hypothyroidism, unspecified: Secondary | ICD-10-CM | POA: Diagnosis not present

## 2016-11-12 DIAGNOSIS — M8588 Other specified disorders of bone density and structure, other site: Secondary | ICD-10-CM | POA: Diagnosis not present

## 2016-11-28 DIAGNOSIS — N390 Urinary tract infection, site not specified: Secondary | ICD-10-CM | POA: Diagnosis not present

## 2016-11-29 DIAGNOSIS — N390 Urinary tract infection, site not specified: Secondary | ICD-10-CM | POA: Diagnosis not present

## 2016-11-29 DIAGNOSIS — M65332 Trigger finger, left middle finger: Secondary | ICD-10-CM | POA: Diagnosis not present

## 2016-11-29 DIAGNOSIS — M5431 Sciatica, right side: Secondary | ICD-10-CM | POA: Diagnosis not present

## 2016-12-13 ENCOUNTER — Other Ambulatory Visit: Payer: Medicare Other

## 2016-12-23 ENCOUNTER — Ambulatory Visit
Admission: RE | Admit: 2016-12-23 | Discharge: 2016-12-23 | Disposition: A | Discharge status: Home / Self Care | Payer: Medicare Other | Source: Ambulatory Visit | Attending: Endocrinology | Admitting: Endocrinology

## 2016-12-23 DIAGNOSIS — E049 Nontoxic goiter, unspecified: Secondary | ICD-10-CM

## 2016-12-23 DIAGNOSIS — E042 Nontoxic multinodular goiter: Secondary | ICD-10-CM | POA: Diagnosis not present

## 2016-12-25 DIAGNOSIS — E039 Hypothyroidism, unspecified: Secondary | ICD-10-CM | POA: Diagnosis not present

## 2017-01-01 DIAGNOSIS — E049 Nontoxic goiter, unspecified: Secondary | ICD-10-CM | POA: Diagnosis not present

## 2017-01-01 DIAGNOSIS — E039 Hypothyroidism, unspecified: Secondary | ICD-10-CM | POA: Diagnosis not present

## 2017-01-22 DIAGNOSIS — Z6831 Body mass index (BMI) 31.0-31.9, adult: Secondary | ICD-10-CM | POA: Diagnosis not present

## 2017-01-22 DIAGNOSIS — E669 Obesity, unspecified: Secondary | ICD-10-CM | POA: Diagnosis not present

## 2017-01-22 DIAGNOSIS — N183 Chronic kidney disease, stage 3 (moderate): Secondary | ICD-10-CM | POA: Diagnosis not present

## 2017-01-22 DIAGNOSIS — I129 Hypertensive chronic kidney disease with stage 1 through stage 4 chronic kidney disease, or unspecified chronic kidney disease: Secondary | ICD-10-CM | POA: Diagnosis not present

## 2017-01-22 DIAGNOSIS — F5101 Primary insomnia: Secondary | ICD-10-CM | POA: Diagnosis not present

## 2017-02-12 DIAGNOSIS — H2513 Age-related nuclear cataract, bilateral: Secondary | ICD-10-CM | POA: Diagnosis not present

## 2017-03-07 DIAGNOSIS — J301 Allergic rhinitis due to pollen: Secondary | ICD-10-CM | POA: Diagnosis not present

## 2017-03-07 DIAGNOSIS — R05 Cough: Secondary | ICD-10-CM | POA: Diagnosis not present

## 2017-03-07 DIAGNOSIS — R0982 Postnasal drip: Secondary | ICD-10-CM | POA: Diagnosis not present

## 2017-05-23 DIAGNOSIS — N8111 Cystocele, midline: Secondary | ICD-10-CM | POA: Diagnosis not present

## 2017-05-23 DIAGNOSIS — Z01411 Encounter for gynecological examination (general) (routine) with abnormal findings: Secondary | ICD-10-CM | POA: Diagnosis not present

## 2017-07-29 DIAGNOSIS — E039 Hypothyroidism, unspecified: Secondary | ICD-10-CM | POA: Diagnosis not present

## 2017-07-29 DIAGNOSIS — I129 Hypertensive chronic kidney disease with stage 1 through stage 4 chronic kidney disease, or unspecified chronic kidney disease: Secondary | ICD-10-CM | POA: Diagnosis not present

## 2017-07-29 DIAGNOSIS — Z79899 Other long term (current) drug therapy: Secondary | ICD-10-CM | POA: Diagnosis not present

## 2017-07-29 DIAGNOSIS — N183 Chronic kidney disease, stage 3 (moderate): Secondary | ICD-10-CM | POA: Diagnosis not present

## 2017-07-29 DIAGNOSIS — E7800 Pure hypercholesterolemia, unspecified: Secondary | ICD-10-CM | POA: Diagnosis not present

## 2017-07-31 DIAGNOSIS — I129 Hypertensive chronic kidney disease with stage 1 through stage 4 chronic kidney disease, or unspecified chronic kidney disease: Secondary | ICD-10-CM | POA: Diagnosis not present

## 2017-07-31 DIAGNOSIS — E7800 Pure hypercholesterolemia, unspecified: Secondary | ICD-10-CM | POA: Diagnosis not present

## 2017-07-31 DIAGNOSIS — Z79899 Other long term (current) drug therapy: Secondary | ICD-10-CM | POA: Diagnosis not present

## 2017-08-29 DIAGNOSIS — Z803 Family history of malignant neoplasm of breast: Secondary | ICD-10-CM | POA: Diagnosis not present

## 2017-08-29 DIAGNOSIS — Z1231 Encounter for screening mammogram for malignant neoplasm of breast: Secondary | ICD-10-CM | POA: Diagnosis not present

## 2017-09-04 DIAGNOSIS — E039 Hypothyroidism, unspecified: Secondary | ICD-10-CM | POA: Diagnosis not present

## 2017-09-04 DIAGNOSIS — N183 Chronic kidney disease, stage 3 (moderate): Secondary | ICD-10-CM | POA: Diagnosis not present

## 2017-09-04 DIAGNOSIS — I129 Hypertensive chronic kidney disease with stage 1 through stage 4 chronic kidney disease, or unspecified chronic kidney disease: Secondary | ICD-10-CM | POA: Diagnosis not present

## 2017-10-08 DIAGNOSIS — Z23 Encounter for immunization: Secondary | ICD-10-CM | POA: Diagnosis not present

## 2017-10-08 DIAGNOSIS — Z1389 Encounter for screening for other disorder: Secondary | ICD-10-CM | POA: Diagnosis not present

## 2017-10-08 DIAGNOSIS — I129 Hypertensive chronic kidney disease with stage 1 through stage 4 chronic kidney disease, or unspecified chronic kidney disease: Secondary | ICD-10-CM | POA: Diagnosis not present

## 2017-10-08 DIAGNOSIS — N183 Chronic kidney disease, stage 3 (moderate): Secondary | ICD-10-CM | POA: Diagnosis not present

## 2017-10-08 DIAGNOSIS — Z0000 Encounter for general adult medical examination without abnormal findings: Secondary | ICD-10-CM | POA: Diagnosis not present

## 2017-10-08 DIAGNOSIS — E669 Obesity, unspecified: Secondary | ICD-10-CM | POA: Diagnosis not present

## 2017-10-08 DIAGNOSIS — Z6832 Body mass index (BMI) 32.0-32.9, adult: Secondary | ICD-10-CM | POA: Diagnosis not present

## 2017-10-08 DIAGNOSIS — F5101 Primary insomnia: Secondary | ICD-10-CM | POA: Diagnosis not present

## 2017-10-09 DIAGNOSIS — L814 Other melanin hyperpigmentation: Secondary | ICD-10-CM | POA: Diagnosis not present

## 2017-10-09 DIAGNOSIS — L578 Other skin changes due to chronic exposure to nonionizing radiation: Secondary | ICD-10-CM | POA: Diagnosis not present

## 2017-10-09 DIAGNOSIS — L821 Other seborrheic keratosis: Secondary | ICD-10-CM | POA: Diagnosis not present

## 2017-10-09 DIAGNOSIS — L820 Inflamed seborrheic keratosis: Secondary | ICD-10-CM | POA: Diagnosis not present

## 2017-10-09 DIAGNOSIS — D225 Melanocytic nevi of trunk: Secondary | ICD-10-CM | POA: Diagnosis not present

## 2017-12-12 DIAGNOSIS — F419 Anxiety disorder, unspecified: Secondary | ICD-10-CM | POA: Diagnosis not present

## 2017-12-12 DIAGNOSIS — E039 Hypothyroidism, unspecified: Secondary | ICD-10-CM | POA: Diagnosis not present

## 2017-12-12 DIAGNOSIS — Z79899 Other long term (current) drug therapy: Secondary | ICD-10-CM | POA: Diagnosis not present

## 2017-12-12 DIAGNOSIS — I129 Hypertensive chronic kidney disease with stage 1 through stage 4 chronic kidney disease, or unspecified chronic kidney disease: Secondary | ICD-10-CM | POA: Diagnosis not present

## 2017-12-12 DIAGNOSIS — R002 Palpitations: Secondary | ICD-10-CM | POA: Diagnosis not present

## 2017-12-12 DIAGNOSIS — N183 Chronic kidney disease, stage 3 (moderate): Secondary | ICD-10-CM | POA: Diagnosis not present

## 2017-12-17 DIAGNOSIS — E039 Hypothyroidism, unspecified: Secondary | ICD-10-CM | POA: Diagnosis not present

## 2017-12-25 DIAGNOSIS — E039 Hypothyroidism, unspecified: Secondary | ICD-10-CM | POA: Diagnosis not present

## 2017-12-25 DIAGNOSIS — E049 Nontoxic goiter, unspecified: Secondary | ICD-10-CM | POA: Diagnosis not present

## 2018-01-28 DIAGNOSIS — I129 Hypertensive chronic kidney disease with stage 1 through stage 4 chronic kidney disease, or unspecified chronic kidney disease: Secondary | ICD-10-CM | POA: Diagnosis not present

## 2018-01-28 DIAGNOSIS — N183 Chronic kidney disease, stage 3 (moderate): Secondary | ICD-10-CM | POA: Diagnosis not present

## 2018-02-06 ENCOUNTER — Telehealth: Payer: Self-pay | Admitting: *Deleted

## 2018-02-06 NOTE — Telephone Encounter (Signed)
REFERRAL SENT TO SCHEDULING.  °

## 2018-02-25 DIAGNOSIS — R002 Palpitations: Secondary | ICD-10-CM | POA: Diagnosis not present

## 2018-02-25 DIAGNOSIS — N183 Chronic kidney disease, stage 3 (moderate): Secondary | ICD-10-CM | POA: Diagnosis not present

## 2018-02-25 DIAGNOSIS — I129 Hypertensive chronic kidney disease with stage 1 through stage 4 chronic kidney disease, or unspecified chronic kidney disease: Secondary | ICD-10-CM | POA: Diagnosis not present

## 2018-02-25 DIAGNOSIS — R5383 Other fatigue: Secondary | ICD-10-CM | POA: Diagnosis not present

## 2018-02-25 DIAGNOSIS — Z79899 Other long term (current) drug therapy: Secondary | ICD-10-CM | POA: Diagnosis not present

## 2018-02-25 DIAGNOSIS — F5101 Primary insomnia: Secondary | ICD-10-CM | POA: Diagnosis not present

## 2018-03-31 IMAGING — US US THYROID
1 series · 14 of 25 positions shown · non-contrast
Comparison: 06/06/2016

CLINICAL DATA: Goiter.

EXAM:
THYROID ULTRASOUND
TECHNIQUE: Ultrasound examination of the thyroid gland and adjacent soft
tissues was performed.

[Series 1: us thyroid · 0.06mm/px · 14 of 37 slices shown]
[im 1/37]
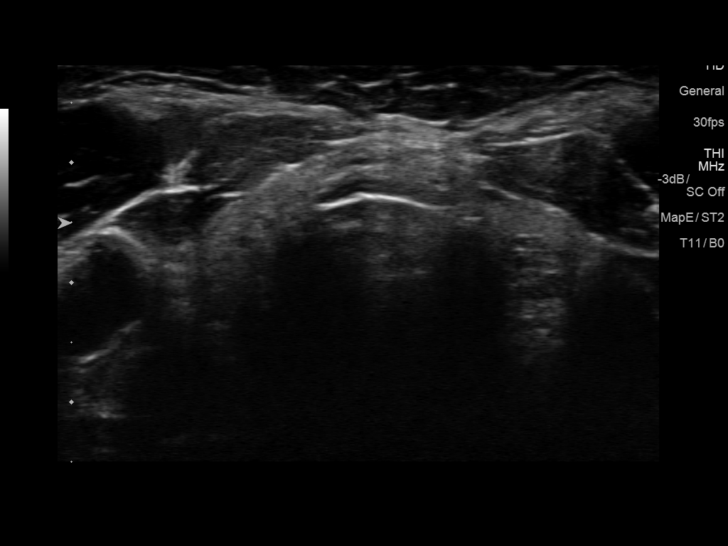
[im 4/37]
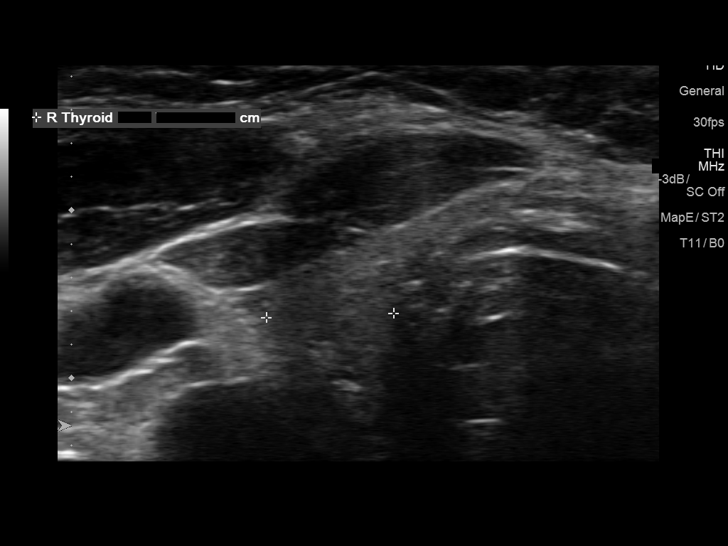
[im 7/37]
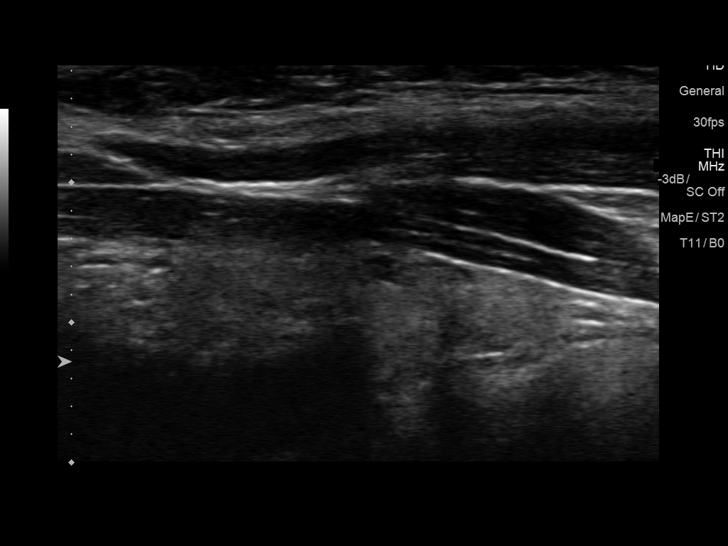
[im 10/37]
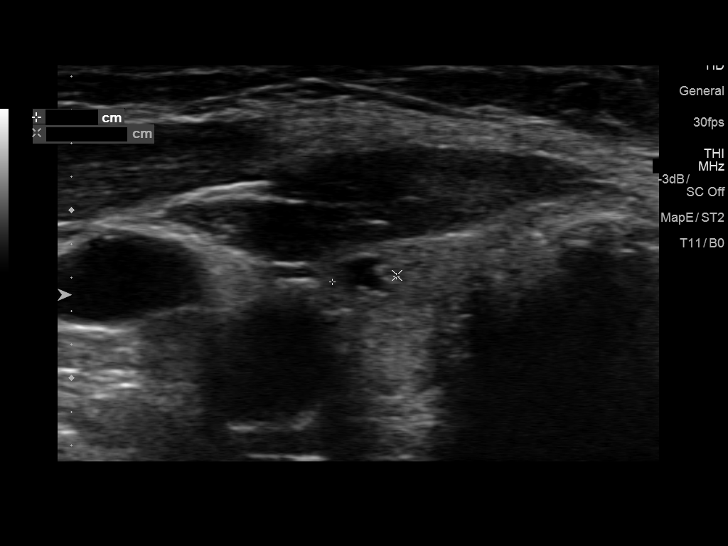
[im 13/37]
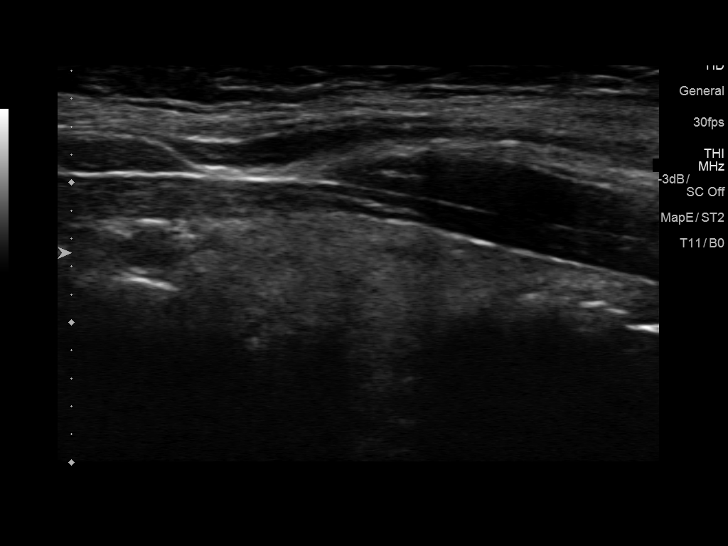
[im 14/37]
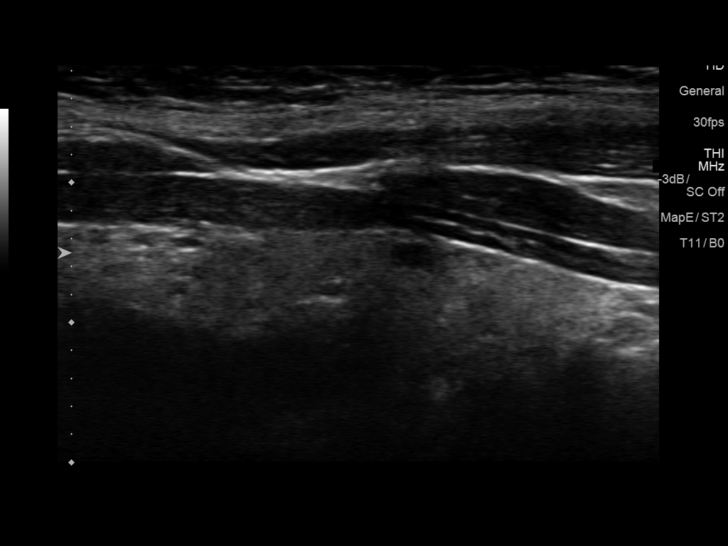
[im 17/37]
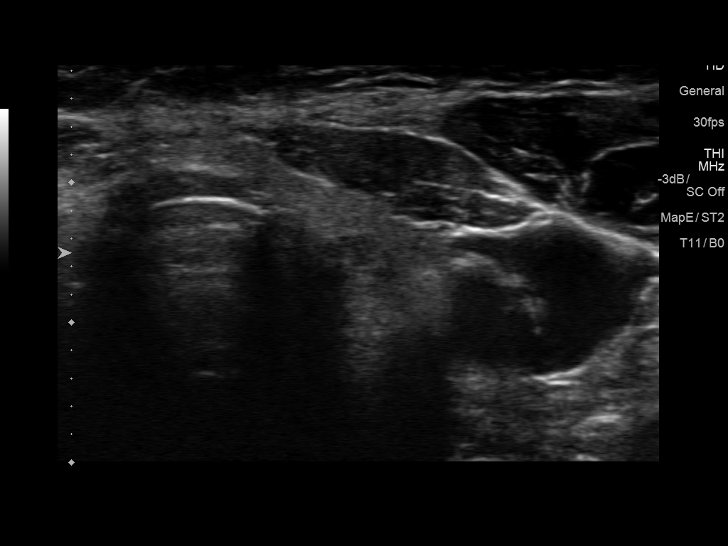
[im 20/37]
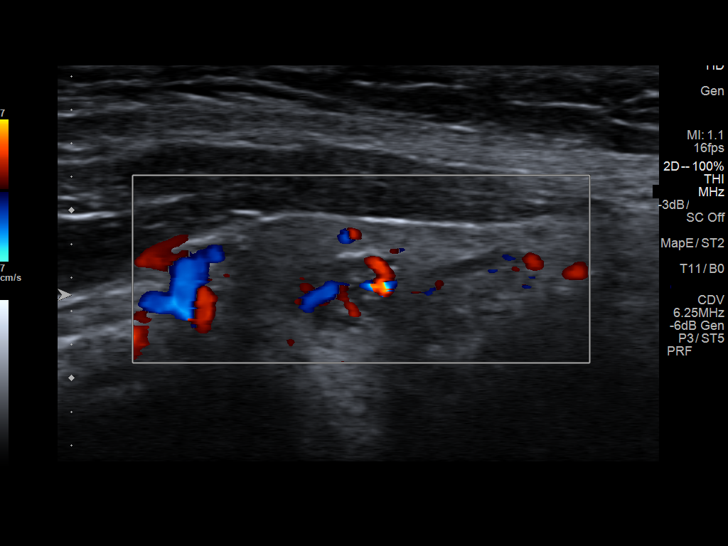
[im 23/37]
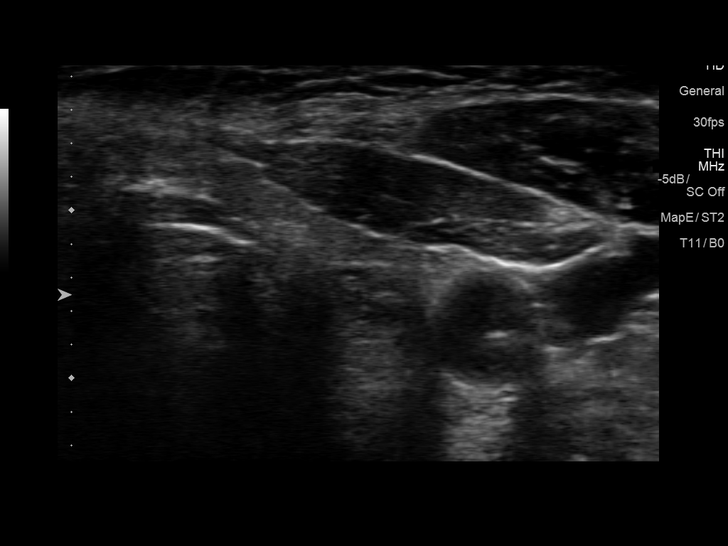
[im 25/37]
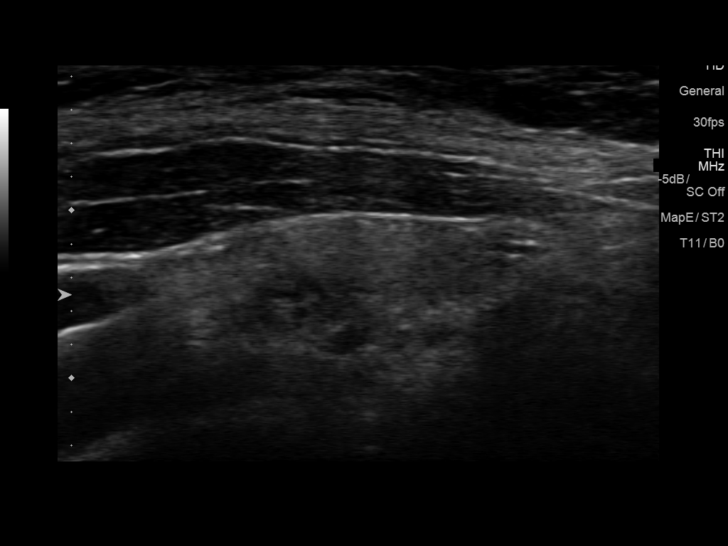
[im 28/37]
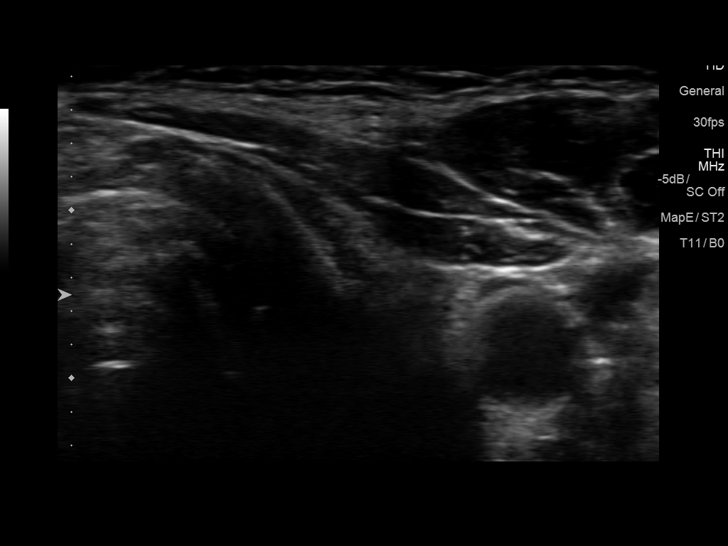
[im 31/37]
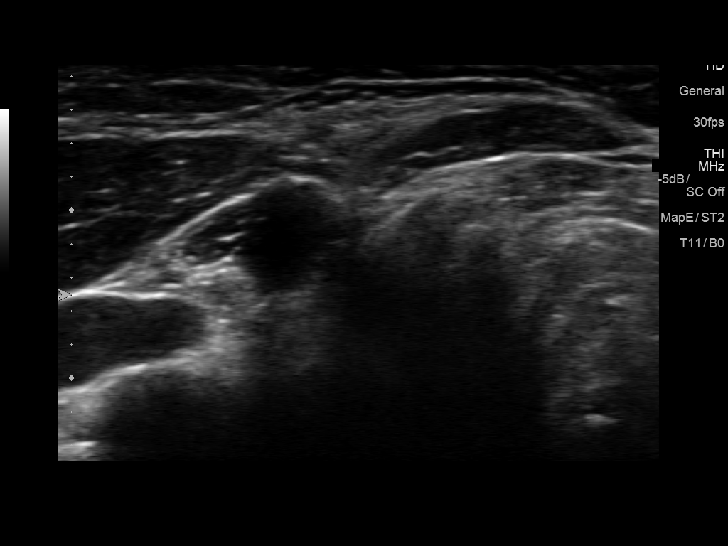
[im 34/37]
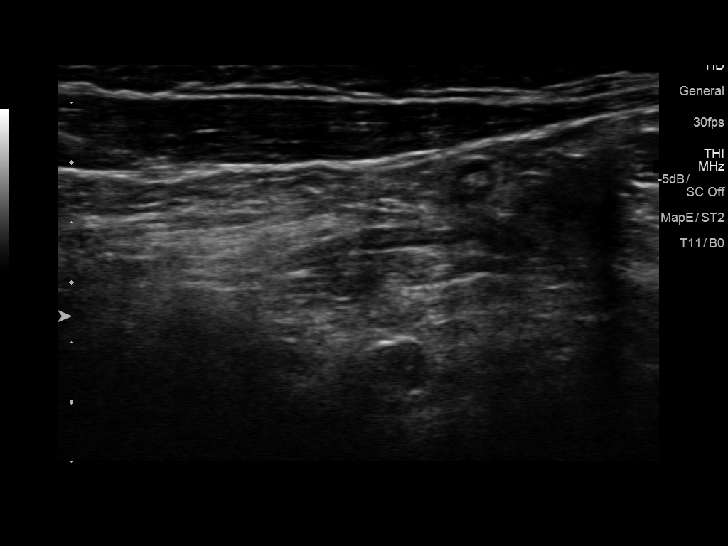
[im 37/37]
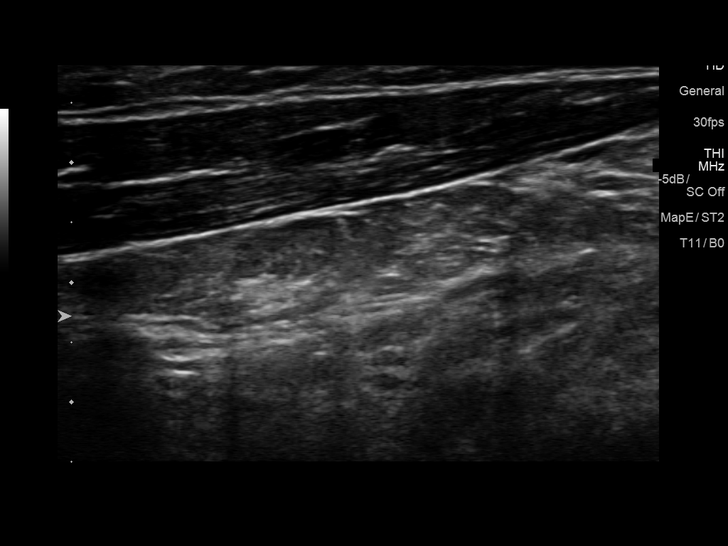

[14 of 25 positions shown; findings below may reference images not displayed]

FINDINGS: Parenchymal Echotexture: Mildly heterogenous

Isthmus: 0.3 cm thickness, stable

Right lobe: 3.1 x 0.8 x 0.8 cm (previously 3 x 1.3 x 1.5)

Left lobe: 2.3 x 0.9 x 0.9 cm (previously 2.2 x 1.1 x   1.1)

_________________________________________________________

Estimated total number of nodules >/= 1 cm: 0

Number of spongiform nodules >/=  2 cm not described below (TR1): 0

Number of mixed cystic and solid nodules >/= 1.5 cm not described
below (TR2): 0

0.4 cm complex cyst, inferior pole right lobe, previously   0.5.

0.5 cm spongiform nodule, mid left, previously 0.8.
IMPRESSION: 1. Small thyroid with small bilateral benign lesions. No imaging
follow-up or biopsy indicated.

The above is in keeping with the ACR TI-RADS recommendations - [HOSPITAL] 3511;[DATE].

## 2018-04-20 ENCOUNTER — Encounter: Payer: Self-pay | Admitting: Cardiovascular Disease

## 2018-04-20 ENCOUNTER — Ambulatory Visit (INDEPENDENT_AMBULATORY_CARE_PROVIDER_SITE_OTHER): Payer: Medicare Other | Admitting: Cardiovascular Disease

## 2018-04-20 VITALS — BP 142/78 | HR 81 | Ht 60.0 in | Wt 169.0 lb

## 2018-04-20 DIAGNOSIS — E7800 Pure hypercholesterolemia, unspecified: Secondary | ICD-10-CM | POA: Diagnosis not present

## 2018-04-20 DIAGNOSIS — I10 Essential (primary) hypertension: Secondary | ICD-10-CM | POA: Diagnosis not present

## 2018-04-20 DIAGNOSIS — E669 Obesity, unspecified: Secondary | ICD-10-CM | POA: Diagnosis not present

## 2018-04-20 NOTE — Progress Notes (Signed)
Cardiology Office Note:    Date:  04/21/2018   ID:  Shelley [name], DOB 03-05-1932, MRN [id number]  PCP:  Lajean Manes, MD  Cardiologist:  No primary care provider on file.   Referring MD: Lajean Manes, MD   Chief Complaint  Patient presents with  . Hypertension  Hyperlipidemia  History of Present Illness:    Shelley [name] is a 84 y.o. female with a hx of hypothyroidism hypertension and hyperlipidemia here to establish cardiology follow-up.  Although she has never been my patient, I know her well since she always accompanied her husband, [name] to his office appointments; he passed away a few months ago.  After her husband passed away she has become more sedentary he readily admits that she spends too much time watching Netflix.  She has had some issues with anxiety and buspirone has helped.  Recently, decided she will spend more time at the gym and generally being more active.  She volunteers at church.  She is socially engaged and frequently daughters.  Labs performed with Dr. Felipa Eth glucose of 90 and a creatinine of 0.89, normal thyroid tests.  Is chronically on simvastatin lisinopril and hydrochlorothiazide for blood pressure control.  This has not required adjustment in many years.  The patient specifically denies any chest pain at rest exertion, dyspnea at rest or with exertion, orthopnea, paroxysmal nocturnal dyspnea, syncope, palpitations, focal neurological deficits, intermittent claudication, lower extremity edema, unexplained weight gain, cough, hemoptysis or wheezing.   Past Medical History:  Diagnosis Date  . Hyperlipidemia   . Hypertension   . Thyroid disease     History reviewed. No pertinent surgical history.  Current Medications: Current Meds  Medication Sig  . aspirin EC 81 MG tablet Take 81 mg by mouth daily.  . Biotin 1000 MCG tablet Take 1 tablet by mouth daily.  . busPIRone (BUSPAR) 5 MG tablet Take 1 tablet by mouth daily.  . calcium  carbonate (CALCIUM 600) 600 MG TABS tablet Take 600 mg by mouth daily with breakfast.  . Cholecalciferol (D3-1000) 1000 units capsule Take 1,000 Units by mouth daily.  . hydrochlorothiazide (MICROZIDE) 12.5 MG capsule Take 12.5 mg by mouth daily.  Marland Kitchen levothyroxine (SYNTHROID, LEVOTHROID) 75 MCG tablet Take 88 mcg by mouth daily before breakfast.  . lisinopril (PRINIVIL,ZESTRIL) 5 MG tablet Take 5 mg by mouth daily.  . Multiple Vitamin (MULTIVITAMIN WITH MINERALS) TABS tablet Take 1 tablet by mouth daily.  . Omega 3 1000 MG CAPS Take 1 capsule by mouth daily.  Marland Kitchen omeprazole (PRILOSEC) 40 MG capsule Take 40 mg by mouth as directed.  . simvastatin (ZOCOR) 40 MG tablet Take 40 mg by mouth daily.     Allergies:   Patient has no known allergies.   Social History   Socioeconomic History  . Marital status: Married    Spouse name: Not on file  . Number of children: Not on file  . Years of education: Not on file  . Highest education level: Not on file  Occupational History  . Not on file  Social Needs  . Financial resource strain: Not on file  . Food insecurity:    Worry: Not on file    Inability: Not on file  . Transportation needs:    Medical: Not on file    Non-medical: Not on file  Tobacco Use  . Smoking status: Never Smoker  . Smokeless tobacco: Never Used  Substance and Sexual Activity  . Alcohol use: Not on file  . Drug use: Not  on file  . Sexual activity: Not on file  Lifestyle  . Physical activity:    Days per week: Not on file    Minutes per session: Not on file  . Stress: Not on file  Relationships  . Social connections:    Talks on phone: Not on file    Gets together: Not on file    Attends religious service: Not on file    Active member of club or organization: Not on file    Attends meetings of clubs or organizations: Not on file    Relationship status: Not on file  Other Topics Concern  . Not on file  Social History Narrative  . Not on file     Family  History: Negative for early onset coronary vascular disease ROS:   Please see the history of present illness.     All other systems reviewed and are negative.  EKGs/Labs/Other Studies Reviewed:    The following studies were reviewed today: Notes from Dr. Felipa Eth  EKG:  EKG is  ordered today.  The ekg ordered today demonstrates sinus rhythm, left atrial abnormality, borderline QTC 460 ms  Recent Labs: TSH 1.28 glucose 90, creatinine 0.89, potassium 4.1 Recent Lipid Panel No results found for: CHOL, TRIG, HDL, CHOLHDL, VLDL, LDLCALC, LDLDIRECT  Physical Exam:    VS:  BP (!) 142/78   Pulse 81   Ht 5' (1.524 m)   Wt 169 lb (76.7 kg)   BMI 33.01 kg/m     Wt Readings from Last 3 Encounters:  04/20/18 169 lb (76.7 kg)     GEN: Mildly obese, well nourished, well developed in no acute distress HEENT: Normal NECK: No JVD; No carotid bruits LYMPHATICS: No lymphadenopathy CARDIAC: RRR, no murmurs, rubs, gallops RESPIRATORY:  Clear to auscultation without rales, wheezing or rhonchi  ABDOMEN: Soft, non-tender, non-distended MUSCULOSKELETAL:  No edema; No deformity  SKIN: Warm and dry NEUROLOGIC:  Alert and oriented x 3 PSYCHIATRIC:  Normal affect   ASSESSMENT:    1. Essential hypertension   2. Pure hypercholesterolemia   3. Obesity (BMI 30.0-34.9)    PLAN:    In order of problems listed above:  1. HTN: Mildly elevated on arrival, when rechecked 131/78 mmHg.  Regardless, I would not recommend a change in medications, but with rather encouraged her to lose weight and perform more physical activity. 2. HLP: We will get a copy of labs from Dr. Felipa Eth, but she seems to be on appropriate treatment.  In the absence of coronary/vascular   Medication Adjustments/Labs and Tests Ordered: Current medicines are reviewed at length with the patient today.  Concerns regarding medicines are outlined above.  Orders Placed This Encounter  Procedures  . EKG 12-Lead   No orders of  the defined types were placed in this encounter.   Patient Instructions  Dr Sallyanne Kuster recommends that you schedule a follow-up appointment in 12 months. You will receive a reminder letter in the mail two months in advance. If you don't receive a letter, please call our office to schedule the follow-up appointment.  If you need a refill on your cardiac medications before your next appointment, please call your pharmacy.    Signed, Sanda Klein, MD  04/21/2018 5:08 PM    [place]

## 2018-04-20 NOTE — Patient Instructions (Signed)
Dr Croitoru recommends that you schedule a follow-up appointment in 12 months. You will receive a reminder letter in the mail two months in advance. If you don't receive a letter, please call our office to schedule the follow-up appointment.  If you need a refill on your cardiac medications before your next appointment, please call your pharmacy. 

## 2018-04-21 ENCOUNTER — Encounter: Payer: Self-pay | Admitting: Cardiovascular Disease

## 2018-04-30 DIAGNOSIS — N183 Chronic kidney disease, stage 3 (moderate): Secondary | ICD-10-CM | POA: Diagnosis not present

## 2018-04-30 DIAGNOSIS — I129 Hypertensive chronic kidney disease with stage 1 through stage 4 chronic kidney disease, or unspecified chronic kidney disease: Secondary | ICD-10-CM | POA: Diagnosis not present

## 2018-07-22 DIAGNOSIS — J301 Allergic rhinitis due to pollen: Secondary | ICD-10-CM | POA: Diagnosis not present

## 2018-08-07 DIAGNOSIS — J209 Acute bronchitis, unspecified: Secondary | ICD-10-CM | POA: Diagnosis not present

## 2018-08-28 DIAGNOSIS — T464X5A Adverse effect of angiotensin-converting-enzyme inhibitors, initial encounter: Secondary | ICD-10-CM | POA: Diagnosis not present

## 2018-08-28 DIAGNOSIS — N183 Chronic kidney disease, stage 3 (moderate): Secondary | ICD-10-CM | POA: Diagnosis not present

## 2018-08-28 DIAGNOSIS — R05 Cough: Secondary | ICD-10-CM | POA: Diagnosis not present

## 2018-08-28 DIAGNOSIS — Z23 Encounter for immunization: Secondary | ICD-10-CM | POA: Diagnosis not present

## 2018-08-28 DIAGNOSIS — I129 Hypertensive chronic kidney disease with stage 1 through stage 4 chronic kidney disease, or unspecified chronic kidney disease: Secondary | ICD-10-CM | POA: Diagnosis not present

## 2018-08-28 DIAGNOSIS — M25552 Pain in left hip: Secondary | ICD-10-CM | POA: Diagnosis not present

## 2018-09-02 DIAGNOSIS — M25552 Pain in left hip: Secondary | ICD-10-CM | POA: Diagnosis not present

## 2018-09-02 DIAGNOSIS — M5136 Other intervertebral disc degeneration, lumbar region: Secondary | ICD-10-CM | POA: Diagnosis not present

## 2018-09-03 DIAGNOSIS — Z803 Family history of malignant neoplasm of breast: Secondary | ICD-10-CM | POA: Diagnosis not present

## 2018-09-03 DIAGNOSIS — Z1231 Encounter for screening mammogram for malignant neoplasm of breast: Secondary | ICD-10-CM | POA: Diagnosis not present

## 2018-09-09 DIAGNOSIS — L821 Other seborrheic keratosis: Secondary | ICD-10-CM | POA: Diagnosis not present

## 2018-09-09 DIAGNOSIS — L819 Disorder of pigmentation, unspecified: Secondary | ICD-10-CM | POA: Diagnosis not present

## 2018-09-09 DIAGNOSIS — L570 Actinic keratosis: Secondary | ICD-10-CM | POA: Diagnosis not present

## 2018-09-09 DIAGNOSIS — D485 Neoplasm of uncertain behavior of skin: Secondary | ICD-10-CM | POA: Diagnosis not present

## 2018-09-09 DIAGNOSIS — L814 Other melanin hyperpigmentation: Secondary | ICD-10-CM | POA: Diagnosis not present

## 2018-09-09 DIAGNOSIS — D229 Melanocytic nevi, unspecified: Secondary | ICD-10-CM | POA: Diagnosis not present

## 2018-09-09 DIAGNOSIS — C44529 Squamous cell carcinoma of skin of other part of trunk: Secondary | ICD-10-CM | POA: Diagnosis not present

## 2018-09-11 DIAGNOSIS — H2513 Age-related nuclear cataract, bilateral: Secondary | ICD-10-CM | POA: Diagnosis not present

## 2018-09-23 DIAGNOSIS — M5442 Lumbago with sciatica, left side: Secondary | ICD-10-CM | POA: Diagnosis not present

## 2018-09-23 DIAGNOSIS — I129 Hypertensive chronic kidney disease with stage 1 through stage 4 chronic kidney disease, or unspecified chronic kidney disease: Secondary | ICD-10-CM | POA: Diagnosis not present

## 2018-09-23 DIAGNOSIS — G8929 Other chronic pain: Secondary | ICD-10-CM | POA: Diagnosis not present

## 2018-10-13 DIAGNOSIS — K909 Intestinal malabsorption, unspecified: Secondary | ICD-10-CM | POA: Diagnosis not present

## 2018-10-13 DIAGNOSIS — K219 Gastro-esophageal reflux disease without esophagitis: Secondary | ICD-10-CM | POA: Diagnosis not present

## 2018-10-13 DIAGNOSIS — M85852 Other specified disorders of bone density and structure, left thigh: Secondary | ICD-10-CM | POA: Diagnosis not present

## 2018-10-13 DIAGNOSIS — I129 Hypertensive chronic kidney disease with stage 1 through stage 4 chronic kidney disease, or unspecified chronic kidney disease: Secondary | ICD-10-CM | POA: Diagnosis not present

## 2018-10-13 DIAGNOSIS — Z1389 Encounter for screening for other disorder: Secondary | ICD-10-CM | POA: Diagnosis not present

## 2018-10-13 DIAGNOSIS — R05 Cough: Secondary | ICD-10-CM | POA: Diagnosis not present

## 2018-10-13 DIAGNOSIS — N183 Chronic kidney disease, stage 3 (moderate): Secondary | ICD-10-CM | POA: Diagnosis not present

## 2018-10-13 DIAGNOSIS — E7800 Pure hypercholesterolemia, unspecified: Secondary | ICD-10-CM | POA: Diagnosis not present

## 2018-10-13 DIAGNOSIS — Z79899 Other long term (current) drug therapy: Secondary | ICD-10-CM | POA: Diagnosis not present

## 2018-10-13 DIAGNOSIS — Z0000 Encounter for general adult medical examination without abnormal findings: Secondary | ICD-10-CM | POA: Diagnosis not present

## 2018-12-15 DIAGNOSIS — B9789 Other viral agents as the cause of diseases classified elsewhere: Secondary | ICD-10-CM | POA: Diagnosis not present

## 2018-12-15 DIAGNOSIS — N183 Chronic kidney disease, stage 3 (moderate): Secondary | ICD-10-CM | POA: Diagnosis not present

## 2018-12-15 DIAGNOSIS — I129 Hypertensive chronic kidney disease with stage 1 through stage 4 chronic kidney disease, or unspecified chronic kidney disease: Secondary | ICD-10-CM | POA: Diagnosis not present

## 2018-12-15 DIAGNOSIS — F5101 Primary insomnia: Secondary | ICD-10-CM | POA: Diagnosis not present

## 2018-12-15 DIAGNOSIS — J069 Acute upper respiratory infection, unspecified: Secondary | ICD-10-CM | POA: Diagnosis not present

## 2018-12-17 DIAGNOSIS — E039 Hypothyroidism, unspecified: Secondary | ICD-10-CM | POA: Diagnosis not present

## 2018-12-21 DIAGNOSIS — N183 Chronic kidney disease, stage 3 (moderate): Secondary | ICD-10-CM | POA: Diagnosis not present

## 2018-12-21 DIAGNOSIS — F411 Generalized anxiety disorder: Secondary | ICD-10-CM | POA: Diagnosis not present

## 2018-12-21 DIAGNOSIS — I129 Hypertensive chronic kidney disease with stage 1 through stage 4 chronic kidney disease, or unspecified chronic kidney disease: Secondary | ICD-10-CM | POA: Diagnosis not present

## 2018-12-21 DIAGNOSIS — R05 Cough: Secondary | ICD-10-CM | POA: Diagnosis not present

## 2018-12-25 DIAGNOSIS — E049 Nontoxic goiter, unspecified: Secondary | ICD-10-CM | POA: Diagnosis not present

## 2018-12-25 DIAGNOSIS — E039 Hypothyroidism, unspecified: Secondary | ICD-10-CM | POA: Diagnosis not present

## 2019-01-19 ENCOUNTER — Ambulatory Visit
Admission: RE | Admit: 2019-01-19 | Discharge: 2019-01-19 | Disposition: A | Discharge status: Home / Self Care | Payer: Medicare Other | Source: Ambulatory Visit | Attending: Geriatric Medicine | Admitting: Geriatric Medicine

## 2019-01-19 ENCOUNTER — Other Ambulatory Visit: Payer: Self-pay | Admitting: Geriatric Medicine

## 2019-01-19 DIAGNOSIS — R05 Cough: Secondary | ICD-10-CM

## 2019-01-19 DIAGNOSIS — K219 Gastro-esophageal reflux disease without esophagitis: Secondary | ICD-10-CM | POA: Diagnosis not present

## 2019-01-19 DIAGNOSIS — I129 Hypertensive chronic kidney disease with stage 1 through stage 4 chronic kidney disease, or unspecified chronic kidney disease: Secondary | ICD-10-CM | POA: Diagnosis not present

## 2019-01-19 DIAGNOSIS — R059 Cough, unspecified: Secondary | ICD-10-CM

## 2019-01-19 DIAGNOSIS — N183 Chronic kidney disease, stage 3 (moderate): Secondary | ICD-10-CM | POA: Diagnosis not present

## 2019-01-19 DIAGNOSIS — J301 Allergic rhinitis due to pollen: Secondary | ICD-10-CM | POA: Diagnosis not present

## 2019-02-10 DIAGNOSIS — J301 Allergic rhinitis due to pollen: Secondary | ICD-10-CM | POA: Diagnosis not present

## 2019-02-10 DIAGNOSIS — I129 Hypertensive chronic kidney disease with stage 1 through stage 4 chronic kidney disease, or unspecified chronic kidney disease: Secondary | ICD-10-CM | POA: Diagnosis not present

## 2019-02-10 DIAGNOSIS — M5431 Sciatica, right side: Secondary | ICD-10-CM | POA: Diagnosis not present

## 2019-02-10 DIAGNOSIS — N183 Chronic kidney disease, stage 3 (moderate): Secondary | ICD-10-CM | POA: Diagnosis not present

## 2019-02-10 DIAGNOSIS — R05 Cough: Secondary | ICD-10-CM | POA: Diagnosis not present

## 2019-02-10 DIAGNOSIS — F5101 Primary insomnia: Secondary | ICD-10-CM | POA: Diagnosis not present

## 2019-02-10 DIAGNOSIS — R202 Paresthesia of skin: Secondary | ICD-10-CM | POA: Diagnosis not present

## 2019-02-22 ENCOUNTER — Telehealth: Payer: Self-pay | Admitting: Internal Medicine

## 2019-02-22 NOTE — Telephone Encounter (Signed)
Lm on vm that as we are not seeing patients in the office patient could call back and schedule a phone visit if she would like.

## 2019-02-22 NOTE — Telephone Encounter (Signed)
Pt requested to schedule an appt.  Pt is having constipation and painful flatulence.  Pt reported taking Miralax and a stool softener but they are not relieving her symptoms.  Please advise.

## 2019-02-22 NOTE — Telephone Encounter (Signed)
Pt would like to schedule a telephone visit.

## 2019-02-22 NOTE — Telephone Encounter (Signed)
Let me know if you need anything.

## 2019-02-22 NOTE — Telephone Encounter (Signed)
Spoke with pt and she is taking 1 dose of miralax daily. Reports she is having issues with constipation and gas and hemorrhoids. Discussed with pt that she can take 2-3 doses of miralax daily to have BM. Pt has been taking gasx for gas and she will continue this as needed. Pt instructed to try otc prep h creams or suppositories. Pt knows to call back if she continues to have problems.

## 2019-02-23 DIAGNOSIS — R300 Dysuria: Secondary | ICD-10-CM | POA: Diagnosis not present

## 2019-04-16 DIAGNOSIS — R238 Other skin changes: Secondary | ICD-10-CM | POA: Diagnosis not present

## 2019-05-03 DIAGNOSIS — F5101 Primary insomnia: Secondary | ICD-10-CM | POA: Diagnosis not present

## 2019-05-03 DIAGNOSIS — H9202 Otalgia, left ear: Secondary | ICD-10-CM | POA: Diagnosis not present

## 2019-05-03 DIAGNOSIS — R202 Paresthesia of skin: Secondary | ICD-10-CM | POA: Diagnosis not present

## 2019-05-24 DIAGNOSIS — R05 Cough: Secondary | ICD-10-CM | POA: Diagnosis not present

## 2019-05-24 DIAGNOSIS — I129 Hypertensive chronic kidney disease with stage 1 through stage 4 chronic kidney disease, or unspecified chronic kidney disease: Secondary | ICD-10-CM | POA: Diagnosis not present

## 2019-05-24 DIAGNOSIS — N183 Chronic kidney disease, stage 3 (moderate): Secondary | ICD-10-CM | POA: Diagnosis not present

## 2019-06-16 DIAGNOSIS — N183 Chronic kidney disease, stage 3 (moderate): Secondary | ICD-10-CM | POA: Diagnosis not present

## 2019-06-16 DIAGNOSIS — F411 Generalized anxiety disorder: Secondary | ICD-10-CM | POA: Diagnosis not present

## 2019-06-16 DIAGNOSIS — I129 Hypertensive chronic kidney disease with stage 1 through stage 4 chronic kidney disease, or unspecified chronic kidney disease: Secondary | ICD-10-CM | POA: Diagnosis not present

## 2019-07-13 ENCOUNTER — Encounter: Payer: Self-pay | Admitting: Cardiovascular Disease

## 2019-07-13 ENCOUNTER — Other Ambulatory Visit: Payer: Self-pay

## 2019-07-13 ENCOUNTER — Ambulatory Visit (INDEPENDENT_AMBULATORY_CARE_PROVIDER_SITE_OTHER): Payer: Medicare Other | Admitting: Cardiovascular Disease

## 2019-07-13 VITALS — BP 150/90 | HR 77 | Temp 97.9°F | Ht 62.0 in | Wt 167.6 lb

## 2019-07-13 DIAGNOSIS — E7800 Pure hypercholesterolemia, unspecified: Secondary | ICD-10-CM

## 2019-07-13 DIAGNOSIS — I10 Essential (primary) hypertension: Secondary | ICD-10-CM | POA: Diagnosis not present

## 2019-07-13 MED ORDER — HYDROCHLOROTHIAZIDE 25 MG PO TABS: 25.0000 mg | ORAL_TABLET | Freq: Every day | ORAL | 3 refills | Status: DC

## 2019-07-13 NOTE — Patient Instructions (Addendum)
Medication Instructions:  INCREASE the Hydrochlorothiazide to 25 mg once daily  If you need a refill on your cardiac medications before your next appointment, please call your pharmacy.   Lab work: None ordered If you have labs (blood work) drawn today and your tests are completely normal, you will receive your results only by: Marland Kitchen MyChart Message (if you have MyChart) OR . A paper copy in the mail If you have any lab test that is abnormal or we need to change your treatment, we will call you to review the results.  Testing/Procedures: None ordered  Follow-Up: At [facility], you and your health needs are our priority.  As part of our continuing mission to provide you with exceptional heart care, we have created designated Provider Care Teams.  These Care Teams include your primary Cardiologist (physician) and Advanced Practice Providers (APPs -  Physician Assistants and Nurse Practitioners) who all work together to provide you with the care you need, when you need it. You will need a follow up appointment in 12 months.  Please call our office 2 months in advance to schedule this appointment.  You may see Sanda Klein, MD or one of the following Advanced Practice Providers on your designated Care Team: [place], [place] . Fabian Sharp, PA-C  Any Other Special Instructions Will Be Listed Below (If Applicable). Please keep track of your blood pressure for two weeks and then send a reading through MyChart.

## 2019-07-13 NOTE — Progress Notes (Signed)
Cardiology Office Note:    Date:  07/14/2019   ID:  Shelley [name], DOB March 31, 1932, MRN [id number]  PCP:  Shelley Manes, MD  Cardiologist:  No primary care provider on file.   Referring MD: Shelley Manes, MD   Chief Complaint  Patient presents with  . Hypertension  Hyperlipidemia  History of Present Illness:    Shelley [name] is a 85 y.o. female with a hx of hypothyroidism, hypertension and hyperlipidemia here to establish cardiology follow-up.  She always accompanied her late husband, Shelley [name] to his office appointments; he passed away about a year ago.  She has been having some difficulties with increased anxiety doing the coronavirus pandemic.  She does not feel isolated, since she is in close touch with her family and friends and has been trying to stay away from the news on television.  Nevertheless she finds that her blood pressure is consistently elevated, typically with systolic blood pressures in the 150-160s.  She does not have a dyspnea, chest pain, palpitations, dizziness or syncope, headaches or other neurological complaints.  She has gained weight since she is more sedentary during dependent.  When she notices that her blood pressure is high she becomes even more anxious and the effects tend to snowball.  She has not had problems with lower extremity edema and is careful to avoid sodium in her diet.  She has had difficulty with sleep.  Although she has no trouble falling asleep, she will wake up after about 4 hours.  She is noticed that this is a more prominent complaint since she increased her dose of buspirone for anxiety.      The patient specifically denies any chest pain at rest exertion, dyspnea at rest or with exertion, orthopnea, paroxysmal nocturnal dyspnea, syncope, palpitations, focal neurological deficits, intermittent claudication, lower extremity edema, unexplained weight gain, cough, hemoptysis or wheezing.   Past Medical History:  Diagnosis Date  .  Hyperlipidemia   . Hypertension   . Thyroid disease     History reviewed. No pertinent surgical history.  Current Medications: Current Meds  Medication Sig  . Biotin 1000 MCG tablet Take 1 tablet by mouth daily.  . busPIRone (BUSPAR) 10 MG tablet Take 1 tablet by mouth 3 (three) times daily.   . calcium carbonate (CALCIUM 600) 600 MG TABS tablet Take 600 mg by mouth daily with breakfast.  . Cholecalciferol (D3-1000) 1000 units capsule Take 1,000 Units by mouth daily.  Marland Kitchen levothyroxine (SYNTHROID, LEVOTHROID) 75 MCG tablet Take 88 mcg by mouth daily before breakfast.  . lisinopril (ZESTRIL) 10 MG tablet Take by mouth daily. Pt takes 10 mg at bedtime and 5 mg in the A.M.  . Melatonin 1 MG TABS Take by mouth as directed.  . Multiple Vitamin (MULTIVITAMIN WITH MINERALS) TABS tablet Take 1 tablet by mouth daily.  . Omega 3 1000 MG CAPS Take 1 capsule by mouth daily.  Marland Kitchen omeprazole (PRILOSEC) 40 MG capsule Take 40 mg by mouth as directed.  . simvastatin (ZOCOR) 40 MG tablet Take 40 mg by mouth daily.  . traZODone (DESYREL) 50 MG tablet Take 25 mg by mouth at bedtime.   . [DISCONTINUED] hydrochlorothiazide (MICROZIDE) 12.5 MG capsule Take 12.5 mg by mouth daily.     Allergies:   Patient has no known allergies.   Social History   Socioeconomic History  . Marital status: Married    Spouse name: Not on file  . Number of children: Not on file  . Years of education: Not on file  .  Highest education level: Not on file  Occupational History  . Not on file  Social Needs  . Financial resource strain: Not on file  . Food insecurity    Worry: Not on file    Inability: Not on file  . Transportation needs    Medical: Not on file    Non-medical: Not on file  Tobacco Use  . Smoking status: Never Smoker  . Smokeless tobacco: Never Used  Substance and Sexual Activity  . Alcohol use: Not on file  . Drug use: Not on file  . Sexual activity: Not on file  Lifestyle  . Physical activity     Days per week: Not on file    Minutes per session: Not on file  . Stress: Not on file  Relationships  . Social Herbalist on phone: Not on file    Gets together: Not on file    Attends religious service: Not on file    Active member of club or organization: Not on file    Attends meetings of clubs or organizations: Not on file    Relationship status: Not on file  Other Topics Concern  . Not on file  Social History Narrative  . Not on file     Family History: Negative for early onset coronary vascular disease ROS:   Please see the history of present illness.    All other systems are reviewed and are negative  EKGs/Labs/Other Studies Reviewed:    The following studies were reviewed today: Notes from Dr. Felipa [name]  EKG:  EKG is  ordered today.  It shows normal sinus rhythm, left atrial abnormality, otherwise normal.  Borderline QTC 452 ms  Recent Labs: TSH 1.28 glucose 90, creatinine 0. 7 6, potassium 4.0 Recent Lipid Panel Total cholesterol 203, HDL 54, LDL 102, triglycerides 236 (October 13, 2018)  Physical Exam:    VS:  BP (!) 150/90   Pulse 77   Temp 97.9 F (36.6 C)   Ht 5\' 2"  (1.575 m)   Wt 167 lb 9.6 oz (76 kg)   SpO2 99%   BMI 30.65 kg/m     Wt Readings from Last 3 Encounters:  07/13/19 167 lb 9.6 oz (76 kg)  04/20/18 169 lb (76.7 kg)     General: Alert, oriented x3, no distress, borderline obese Head: no evidence of trauma, PERRL, EOMI, no exophtalmos or lid lag, no myxedema, no xanthelasma; normal ears, nose and oropharynx Neck: normal jugular venous pulsations and no hepatojugular reflux; brisk carotid pulses without delay and no carotid bruits Chest: clear to auscultation, no signs of consolidation by percussion or palpation, normal fremitus, symmetrical and full respiratory excursions Cardiovascular: normal position and quality of the apical impulse, regular rhythm, normal first and second heart sounds, no murmurs, rubs or gallops Abdomen:  no tenderness or distention, no masses by palpation, no abnormal pulsatility or arterial bruits, normal bowel sounds, no hepatosplenomegaly Extremities: no clubbing, cyanosis or edema; 2+ radial, ulnar and brachial pulses bilaterally; 2+ right femoral, posterior tibial and dorsalis pedis pulses; 2+ left femoral, posterior tibial and dorsalis pedis pulses; no subclavian or femoral bruits Neurological: grossly nonfocal Psych: Normal mood and affect   ASSESSMENT:    1. Essential hypertension   2. Hypercholesterolemia    PLAN:    In order of problems listed above:  1. HTN: Blood pressure is consistently elevated, even when she is not particularly anxious.  Increase hydrochlorothiazide to 25 mg once daily.  Send blood pressure readings  through my chart in a couple of weeks. 2. HLP: She does not have known coronary or vascular disease.  No need for medication changes, but encouraged her to become more physically active and avoid carbohydrates (especially sweets and starches with high glycemic index).   Medication Adjustments/Labs and Tests Ordered: Current medicines are reviewed at length with the patient today.  Concerns regarding medicines are outlined above.  Orders Placed This Encounter  Procedures  . EKG 12-Lead   Meds ordered this encounter  Medications  . hydrochlorothiazide (HYDRODIURIL) 25 MG tablet    Sig: Take 1 tablet (25 mg total) by mouth daily.    Dispense:  90 tablet    Refill:  3    Patient Instructions  Medication Instructions:  INCREASE the Hydrochlorothiazide to 25 mg once daily  If you need a refill on your cardiac medications before your next appointment, please call your pharmacy.   Lab work: None ordered If you have labs (blood work) drawn today and your tests are completely normal, you will receive your results only by: Marland Kitchen MyChart Message (if you have MyChart) OR . A paper copy in the mail If you have any lab test that is abnormal or we need to change  your treatment, we will call you to review the results.  Testing/Procedures: None ordered  Follow-Up: At [facility], you and your health needs are our priority.  As part of our continuing mission to provide you with exceptional heart care, we have created designated Provider Care Teams.  These Care Teams include your primary Cardiologist (physician) and Advanced Practice Providers (APPs -  Physician Assistants and Nurse Practitioners) who all work together to provide you with the care you need, when you need it. You will need a follow up appointment in 12 months.  Please call our office 2 months in advance to schedule this appointment.  You may see Sanda Klein, MD or one of the following Advanced Practice Providers on your designated Care Team: [place], [place] . Fabian Sharp, PA-C  Any Other Special Instructions Will Be Listed Below (If Applicable). Please keep track of your blood pressure for two weeks and then send a reading through MyChart.      Signed, Sanda Klein, MD  07/14/2019 8:30 AM    [facility]

## 2019-07-14 ENCOUNTER — Encounter: Payer: Self-pay | Admitting: Cardiovascular Disease

## 2019-07-14 DIAGNOSIS — E7800 Pure hypercholesterolemia, unspecified: Secondary | ICD-10-CM | POA: Insufficient documentation

## 2019-07-14 DIAGNOSIS — N183 Chronic kidney disease, stage 3 (moderate): Secondary | ICD-10-CM | POA: Diagnosis not present

## 2019-07-14 DIAGNOSIS — I10 Essential (primary) hypertension: Secondary | ICD-10-CM | POA: Insufficient documentation

## 2019-07-14 DIAGNOSIS — F411 Generalized anxiety disorder: Secondary | ICD-10-CM | POA: Diagnosis not present

## 2019-07-14 DIAGNOSIS — J301 Allergic rhinitis due to pollen: Secondary | ICD-10-CM | POA: Diagnosis not present

## 2019-07-14 DIAGNOSIS — I129 Hypertensive chronic kidney disease with stage 1 through stage 4 chronic kidney disease, or unspecified chronic kidney disease: Secondary | ICD-10-CM | POA: Diagnosis not present

## 2019-07-22 DIAGNOSIS — L218 Other seborrheic dermatitis: Secondary | ICD-10-CM | POA: Diagnosis not present

## 2019-07-22 DIAGNOSIS — L819 Disorder of pigmentation, unspecified: Secondary | ICD-10-CM | POA: Diagnosis not present

## 2019-07-22 DIAGNOSIS — L570 Actinic keratosis: Secondary | ICD-10-CM | POA: Diagnosis not present

## 2019-07-27 ENCOUNTER — Telehealth: Payer: Self-pay | Admitting: Cardiovascular Disease

## 2019-07-27 NOTE — Telephone Encounter (Signed)
New Message:    Pt says she can not get into  My-Chart- She have tried to reach IT, It says she has  An invalid code.

## 2019-07-27 NOTE — Telephone Encounter (Signed)
Left a message for the patient to call back.  The patient's activation code from her last avs is:  Your Activation Code is: [id number]

## 2019-07-29 ENCOUNTER — Encounter

## 2019-07-29 NOTE — Telephone Encounter (Signed)
Per [name] pt was able to sign in to my chart and now has access .Shelley [name]

## 2019-07-29 NOTE — Telephone Encounter (Signed)
Lm to call back ./cy 

## 2019-08-11 DIAGNOSIS — Z23 Encounter for immunization: Secondary | ICD-10-CM | POA: Diagnosis not present

## 2019-09-07 DIAGNOSIS — H2513 Age-related nuclear cataract, bilateral: Secondary | ICD-10-CM | POA: Diagnosis not present

## 2019-09-07 DIAGNOSIS — H52203 Unspecified astigmatism, bilateral: Secondary | ICD-10-CM | POA: Diagnosis not present

## 2019-09-07 DIAGNOSIS — H5203 Hypermetropia, bilateral: Secondary | ICD-10-CM | POA: Diagnosis not present

## 2019-09-15 ENCOUNTER — Other Ambulatory Visit: Payer: Self-pay

## 2019-09-15 DIAGNOSIS — Z20822 Contact with and (suspected) exposure to covid-19: Secondary | ICD-10-CM

## 2019-09-16 LAB — NOVEL CORONAVIRUS, NAA: SARS-CoV-2, NAA: NOT DETECTED

## 2019-09-20 DIAGNOSIS — Z1231 Encounter for screening mammogram for malignant neoplasm of breast: Secondary | ICD-10-CM | POA: Diagnosis not present

## 2019-09-20 DIAGNOSIS — Z803 Family history of malignant neoplasm of breast: Secondary | ICD-10-CM | POA: Diagnosis not present

## 2019-09-23 ENCOUNTER — Other Ambulatory Visit: Payer: Self-pay | Admitting: Cardiovascular Disease

## 2019-09-23 DIAGNOSIS — L218 Other seborrheic dermatitis: Secondary | ICD-10-CM | POA: Diagnosis not present

## 2019-09-23 MED ORDER — HYDROCHLOROTHIAZIDE 25 MG PO TABS: 25.0000 mg | ORAL_TABLET | Freq: Every day | ORAL | 1 refills | Status: DC

## 2019-09-23 NOTE — Telephone Encounter (Signed)
Rx has been sent to the pharmacy electronically. ° °

## 2019-09-23 NOTE — Telephone Encounter (Signed)
Patient needs a refill of  hydrochlorothiazide (HYDRODIURIL) 25 MG   [facility]  [facility] day supply  Previous script was sent to [place] in error   Do not have access to [facility] yet.

## 2019-10-26 ENCOUNTER — Other Ambulatory Visit: Payer: Self-pay | Admitting: Geriatric Medicine

## 2019-10-26 DIAGNOSIS — G8929 Other chronic pain: Secondary | ICD-10-CM | POA: Diagnosis not present

## 2019-10-26 DIAGNOSIS — Z79899 Other long term (current) drug therapy: Secondary | ICD-10-CM | POA: Diagnosis not present

## 2019-10-26 DIAGNOSIS — I10 Essential (primary) hypertension: Secondary | ICD-10-CM | POA: Diagnosis not present

## 2019-10-26 DIAGNOSIS — R05 Cough: Secondary | ICD-10-CM | POA: Diagnosis not present

## 2019-10-26 DIAGNOSIS — R202 Paresthesia of skin: Secondary | ICD-10-CM | POA: Diagnosis not present

## 2019-10-26 DIAGNOSIS — Z0000 Encounter for general adult medical examination without abnormal findings: Secondary | ICD-10-CM | POA: Diagnosis not present

## 2019-10-26 DIAGNOSIS — M8580 Other specified disorders of bone density and structure, unspecified site: Secondary | ICD-10-CM | POA: Diagnosis not present

## 2019-10-26 DIAGNOSIS — M545 Low back pain: Secondary | ICD-10-CM | POA: Diagnosis not present

## 2019-10-26 DIAGNOSIS — Z1389 Encounter for screening for other disorder: Secondary | ICD-10-CM | POA: Diagnosis not present

## 2019-10-26 DIAGNOSIS — E039 Hypothyroidism, unspecified: Secondary | ICD-10-CM | POA: Diagnosis not present

## 2019-10-26 DIAGNOSIS — F5101 Primary insomnia: Secondary | ICD-10-CM | POA: Diagnosis not present

## 2019-11-04 DIAGNOSIS — L218 Other seborrheic dermatitis: Secondary | ICD-10-CM | POA: Diagnosis not present

## 2019-11-04 DIAGNOSIS — L820 Inflamed seborrheic keratosis: Secondary | ICD-10-CM | POA: Diagnosis not present

## 2019-11-04 DIAGNOSIS — L821 Other seborrheic keratosis: Secondary | ICD-10-CM | POA: Diagnosis not present

## 2020-01-24 ENCOUNTER — Other Ambulatory Visit: Payer: Self-pay

## 2020-01-24 ENCOUNTER — Ambulatory Visit
Admission: RE | Admit: 2020-01-24 | Discharge: 2020-01-24 | Disposition: A | Discharge status: Home / Self Care | Payer: Medicare HMO | Source: Ambulatory Visit | Attending: Geriatric Medicine | Admitting: Geriatric Medicine

## 2020-01-24 DIAGNOSIS — M8580 Other specified disorders of bone density and structure, unspecified site: Secondary | ICD-10-CM

## 2020-03-06 ENCOUNTER — Other Ambulatory Visit: Payer: Self-pay | Admitting: Cardiovascular Disease

## 2020-03-09 ENCOUNTER — Telehealth: Payer: Self-pay | Admitting: Cardiovascular Disease

## 2020-03-09 ENCOUNTER — Other Ambulatory Visit: Payer: Self-pay

## 2020-03-09 MED ORDER — HYDROCHLOROTHIAZIDE 25 MG PO TABS: 25.0000 mg | ORAL_TABLET | Freq: Every day | ORAL | 3 refills | Status: DC

## 2020-03-09 NOTE — Telephone Encounter (Signed)
New Message   Pt c/o medication issue:  1. Name of Medication: hydrochlorothiazide (HYDRODIURIL) 25 MG tablet    2. How are you currently taking this medication (dosage and times per day)?   3. Are you having a reaction (difficulty breathing--STAT)?   4. What is your medication issue? Patient is calling because she requested a refill for the medication and it was denied. Patient would like to know why. Please call to discuss.

## 2020-03-09 NOTE — Telephone Encounter (Signed)
RX sent in to the Pharmacy.. pt not due to be seen until 07/2020.

## 2020-03-21 ENCOUNTER — Telehealth: Payer: Self-pay | Admitting: Cardiovascular Disease

## 2020-03-21 NOTE — Telephone Encounter (Signed)
I placed call to patient to get more information. Left message to call office.

## 2020-03-21 NOTE — Telephone Encounter (Signed)
Pt c/o medication issue:  1. Name of Medication: hydrochlorothiazide (HYDRODIURIL) 25 MG tablet  2. How are you currently taking this medication (dosage and times per day)? As directed  3. Are you having a reaction (difficulty breathing--STAT)? no  4. What is your medication issue? Patient states that [facility] needs prior authorization for this medication before anymore refills.

## 2020-03-22 ENCOUNTER — Ambulatory Visit
Admission: RE | Admit: 2020-03-22 | Discharge: 2020-03-22 | Disposition: A | Discharge status: Home / Self Care | Payer: Medicare HMO | Source: Ambulatory Visit | Attending: Internal Medicine | Admitting: Internal Medicine

## 2020-03-22 ENCOUNTER — Other Ambulatory Visit: Payer: Self-pay | Admitting: Internal Medicine

## 2020-03-22 ENCOUNTER — Other Ambulatory Visit: Payer: Self-pay

## 2020-03-22 DIAGNOSIS — W19XXXA Unspecified fall, initial encounter: Secondary | ICD-10-CM

## 2020-03-24 ENCOUNTER — Other Ambulatory Visit: Payer: Self-pay

## 2020-03-24 MED ORDER — HYDROCHLOROTHIAZIDE 25 MG PO TABS: 25.0000 mg | ORAL_TABLET | Freq: Every day | ORAL | 1 refills | Status: DC

## 2020-03-31 NOTE — Telephone Encounter (Signed)
Left a message for the patient to call back to verify that Hydrochlorothiazide needed a prior auth. The office has not received a notice that this has been declined.

## 2020-04-04 NOTE — Telephone Encounter (Signed)
Left a message for the patient to call back.  

## 2020-04-04 NOTE — Telephone Encounter (Signed)
[facility] has stated that they did not need a PA for Hydrochlorothiazide.

## 2020-04-04 NOTE — Telephone Encounter (Signed)
Have not heard back from the patient. If a PA needs to be done, this will be documented in the chart.

## 2020-05-31 ENCOUNTER — Ambulatory Visit: Payer: Medicare HMO | Admitting: Cardiovascular Disease

## 2020-07-12 ENCOUNTER — Encounter: Payer: Self-pay | Admitting: Cardiovascular Disease

## 2020-07-12 ENCOUNTER — Other Ambulatory Visit: Payer: Self-pay

## 2020-07-12 ENCOUNTER — Ambulatory Visit (INDEPENDENT_AMBULATORY_CARE_PROVIDER_SITE_OTHER): Payer: Medicare HMO | Admitting: Cardiovascular Disease

## 2020-07-12 VITALS — BP 173/74 | HR 72 | Ht 62.0 in | Wt 155.6 lb

## 2020-07-12 DIAGNOSIS — I10 Essential (primary) hypertension: Secondary | ICD-10-CM

## 2020-07-12 MED ORDER — LISINOPRIL 20 MG PO TABS: 20.0000 mg | ORAL_TABLET | Freq: Every day | ORAL | 3 refills | Status: DC

## 2020-07-12 NOTE — Progress Notes (Signed)
Cardiology Office Note:    Date:  07/12/2020   ID:  Shelley [name], DOB [birth year], MRN [id number]  PCP:  Lajean Manes, MD  Cardiologist:  No primary care provider on file.   Referring MD: Lajean Manes, MD   Chief Complaint  Patient presents with  . Hypertension  Hyperlipidemia  History of Present Illness:    Shelley [name] is a 86 y.o. female with a hx of hypothyroidism, hypertension and hyperlipidemia.  She always accompanied her late husband, [name] to his office appointments; he passed away about 3 years ago.  A few months ago she moved out of the house that she shared with her husband then she has been very anxious ever since.  She loves her new apartment and remains socially engaged, but nevertheless feels uneasy.  Just before moving, she had a fall with a pattern suggesting orthostatic hypotension, may be related to relative hypovolemia.  She had a bad leg cramp and try to get out of bed to walk it off after which she fell.  It sounds like she briefly lost consciousness.  CT head was okay.  She was found to have abnormalities in her electrolytes and hydrochlorothiazide was discontinued.  Since then she has had occasional problems with mild ankle edema and her blood pressure is consistently in the 140s or 150s.  Follow-up labs on 04/24/2020 showed normal creatinine of 0.83 and normal potassium 4.2.  The patient specifically denies any chest pain at rest exertion, dyspnea at rest or with exertion, orthopnea, paroxysmal nocturnal dyspnea, syncope, palpitations, focal neurological deficits, intermittent claudication, lower extremity edema, unexplained weight gain, cough, hemoptysis or wheezing.      Past Medical History:  Diagnosis Date  . Hyperlipidemia   . Hypertension   . Thyroid disease     History reviewed. No pertinent surgical history.  Current Medications: Current Meds  Medication Sig  . Biotin 1000 MCG tablet Take 1 tablet by mouth daily.  .  busPIRone (BUSPAR) 10 MG tablet Take 1 tablet by mouth 3 (three) times daily.   . calcium carbonate (CALCIUM 600) 600 MG TABS tablet Take 600 mg by mouth daily with breakfast.  . Cholecalciferol (D3-1000) 1000 units capsule Take 1,000 Units by mouth daily.  Marland Kitchen levothyroxine (SYNTHROID, LEVOTHROID) 75 MCG tablet Take 88 mcg by mouth daily before breakfast.  . lisinopril (ZESTRIL) 20 MG tablet Take 1 tablet (20 mg total) by mouth daily.  . Melatonin 1 MG TABS Take by mouth as directed.  . Multiple Vitamin (MULTIVITAMIN WITH MINERALS) TABS tablet Take 1 tablet by mouth daily.  . Omega 3 1000 MG CAPS Take 1 capsule by mouth daily.  Marland Kitchen omeprazole (PRILOSEC) 40 MG capsule Take 40 mg by mouth as directed.  . simvastatin (ZOCOR) 40 MG tablet Take 40 mg by mouth daily.  . traZODone (DESYREL) 50 MG tablet Take 25 mg by mouth at bedtime.   . [DISCONTINUED] hydrochlorothiazide (HYDRODIURIL) 25 MG tablet Take 1 tablet (25 mg total) by mouth daily.  . [DISCONTINUED] lisinopril (ZESTRIL) 10 MG tablet Take by mouth daily. Pt takes 10 mg at bedtime and 5 mg in the A.M.  . [DISCONTINUED] lisinopril (ZESTRIL) 20 MG tablet Take 1 tablet (20 mg total) by mouth daily.     Allergies:   Patient has no known allergies.   Social History   Socioeconomic History  . Marital status: Married    Spouse name: Not on file  . Number of children: Not on file  . Years of education:  Not on file  . Highest education level: Not on file  Occupational History  . Not on file  Tobacco Use  . Smoking status: Never Smoker  . Smokeless tobacco: Never Used  Substance and Sexual Activity  . Alcohol use: Not on file  . Drug use: Not on file  . Sexual activity: Not on file  Other Topics Concern  . Not on file  Social History Narrative  . Not on file   Social Determinants of Health   Financial Resource Strain:   . Difficulty of Paying Living Expenses:   Food Insecurity:   . Worried About Charity fundraiser in the Last Year:    . Arboriculturist in the Last Year:   Transportation Needs:   . Film/video editor (Medical):   Marland Kitchen Lack of Transportation (Non-Medical):   Physical Activity:   . Days of Exercise per Week:   . Minutes of Exercise per Session:   Stress:   . Feeling of Stress :   Social Connections:   . Frequency of Communication with Friends and Family:   . Frequency of Social Gatherings with Friends and Family:   . Attends Religious Services:   . Active Member of Clubs or Organizations:   . Attends Archivist Meetings:   Marland Kitchen Marital Status:      Family History: Negative for early onset coronary or vascular disease ROS:   Please see the history of present illness.    All other systems are reviewed and are negative.   EKGs/Labs/Other Studies Reviewed:    The following studies were reviewed today: CT head from April 2021 and subsequent labs  EKG:  EKG is  ordered today.  It is very similar to the one performed a year earlier and shows sinus rhythm, normal tracing, borderline QTC 462 ms  Recent Labs: 12/24/2019 TSH 0.42   04/24/2020 creatinine 0. 83, potassium 4.2 Recent Lipid Panel Total cholesterol 203, HDL 54, LDL 102, triglycerides 236 (October 13, 2018)  Physical Exam:    VS:  BP (!) 173/74   Pulse 72   Ht 5\' 2"  (1.575 m)   Wt 155 lb 9.6 oz (70.6 kg)   SpO2 99%   BMI 28.46 kg/m     Wt Readings from Last 3 Encounters:  07/12/20 155 lb 9.6 oz (70.6 kg)  07/13/19 167 lb 9.6 oz (76 kg)  04/20/18 169 lb (76.7 kg)     General: Alert, oriented x3, no distress, appears well Head: no evidence of trauma, PERRL, EOMI, no exophtalmos or lid lag, no myxedema, no xanthelasma; normal ears, nose and oropharynx Neck: normal jugular venous pulsations and no hepatojugular reflux; brisk carotid pulses without delay and no carotid bruits Chest: clear to auscultation, no signs of consolidation by percussion or palpation, normal fremitus, symmetrical and full respiratory  excursions Cardiovascular: normal position and quality of the apical impulse, regular rhythm, normal first and second heart sounds, no murmurs, rubs or gallops Abdomen: no tenderness or distention, no masses by palpation, no abnormal pulsatility or arterial bruits, normal bowel sounds, no hepatosplenomegaly Extremities: no clubbing, cyanosis or edema; 2+ radial, ulnar and brachial pulses bilaterally; 2+ right femoral, posterior tibial and dorsalis pedis pulses; 2+ left femoral, posterior tibial and dorsalis pedis pulses; no subclavian or femoral bruits Neurological: grossly nonfocal Psych: Normal mood and affect    ASSESSMENT:    1. Essential hypertension    PLAN:    In order of problems listed above:  1. HTN: Off  hydrochlorothiazide following a fall that was likely due to orthostatic hypotension.  We will increase the lisinopril to 20 mg daily.  We probably should avoid trying to reach optimal blood pressure of 130/80, but at least would like to get the systolic blood pressure under 140.  Send a series of home blood pressure checks via MyChart in a couple of weeks.    Medication Adjustments/Labs and Tests Ordered: Current medicines are reviewed at length with the patient today.  Concerns regarding medicines are outlined above.  Orders Placed This Encounter  Procedures  . EKG 12-Lead   Meds ordered this encounter  Medications  . DISCONTD: lisinopril (ZESTRIL) 20 MG tablet    Sig: Take 1 tablet (20 mg total) by mouth daily.    Dispense:  90 tablet    Refill:  3  . lisinopril (ZESTRIL) 20 MG tablet    Sig: Take 1 tablet (20 mg total) by mouth daily.    Dispense:  90 tablet    Refill:  3    Patient Instructions  Medication Instructions:  INCREASE the Lisinopril to 20 mg once daily  *If you need a refill on your cardiac medications before your next appointment, please call your pharmacy*   Lab Work: None ordered If you have labs (blood work) drawn today and your tests are  completely normal, you will receive your results only by: Marland Kitchen MyChart Message (if you have MyChart) OR . A paper copy in the mail If you have any lab test that is abnormal or we need to change your treatment, we will call you to review the results.   Testing/Procedures: None ordered   Follow-Up: At [facility], you and your health needs are our priority.  As part of our continuing mission to provide you with exceptional heart care, we have created designated Provider Care Teams.  These Care Teams include your primary Cardiologist (physician) and Advanced Practice Providers (APPs -  Physician Assistants and Nurse Practitioners) who all work together to provide you with the care you need, when you need it.  We recommend signing up for the patient portal called "MyChart".  Sign up information is provided on this After Visit Summary.  MyChart is used to connect with patients for Virtual Visits (Telemedicine).  Patients are able to view lab/test results, encounter notes, upcoming appointments, etc.  Non-urgent messages can be sent to your provider as well.   To learn more about what you can do with MyChart, go to [email or web address].    Your next appointment:   12 month(s)  The format for your next appointment:   In Person  Provider:   Sanda Klein, MD   Other Instructions Dr. Sallyanne Kuster would like you to check your blood pressure daily for the next 2 weeks.  Keep a journal of these daily blood pressure and heart rate readings and call our office or send a message through [place] with the results. Thank you!      Signed, Sanda Klein, MD  07/12/2020 6:17 PM    [place]

## 2020-07-12 NOTE — Patient Instructions (Signed)
Medication Instructions:  INCREASE the Lisinopril to 20 mg once daily  *If you need a refill on your cardiac medications before your next appointment, please call your pharmacy*   Lab Work: None ordered If you have labs (blood work) drawn today and your tests are completely normal, you will receive your results only by: Marland Kitchen MyChart Message (if you have MyChart) OR . A paper copy in the mail If you have any lab test that is abnormal or we need to change your treatment, we will call you to review the results.   Testing/Procedures: None ordered   Follow-Up: At [facility], you and your health needs are our priority.  As part of our continuing mission to provide you with exceptional heart care, we have created designated Provider Care Teams.  These Care Teams include your primary Cardiologist (physician) and Advanced Practice Providers (APPs -  Physician Assistants and Nurse Practitioners) who all work together to provide you with the care you need, when you need it.  We recommend signing up for the patient portal called "MyChart".  Sign up information is provided on this After Visit Summary.  MyChart is used to connect with patients for Virtual Visits (Telemedicine).  Patients are able to view lab/test results, encounter notes, upcoming appointments, etc.  Non-urgent messages can be sent to your provider as well.   To learn more about what you can do with MyChart, go to [email or web address].    Your next appointment:   12 month(s)  The format for your next appointment:   In Person  Provider:   Sanda Klein, MD   Other Instructions Dr. Sallyanne Kuster would like you to check your blood pressure daily for the next 2 weeks.  Keep a journal of these daily blood pressure and heart rate readings and call our office or send a message through [place] with the results. Thank you!

## 2020-07-27 ENCOUNTER — Telehealth: Payer: Self-pay | Admitting: Cardiovascular Disease

## 2020-07-27 MED ORDER — HYDROCHLOROTHIAZIDE 12.5 MG PO CAPS
12.5000 mg | ORAL_CAPSULE | ORAL | 3 refills | Status: DC
Start: 2020-07-27 — End: 2021-02-28

## 2020-07-27 NOTE — Telephone Encounter (Signed)
Please restartv HCTZ 12.5 mg every other day (Take on even days of the month, skip on odd days). Report BP back after 2 weeks please.

## 2020-07-27 NOTE — Telephone Encounter (Signed)
Spoke with pt who report even with recent med change, her BP continues to remain elevated. Yesterday 177/84 HR 76 and today 161/77 HR 74. Pt is questioning if MD feels she should restart HCTZ.  Will route to MD recommendations.

## 2020-07-27 NOTE — Telephone Encounter (Signed)
Pt updated and verbalized understanding. New orders placed.  

## 2020-07-27 NOTE — Telephone Encounter (Signed)
    Pt c/o medication issue:  1. Name of Medication: hydrochlorothiazide  2. How are you currently taking this medication (dosage and times per day)?   3. Are you having a reaction (difficulty breathing--STAT)?   4. What is your medication issue? Pt would like to speak with RN about this medication, she would like to know if Dr. Loletha Grayer will prescribe it again.

## 2021-01-10 ENCOUNTER — Telehealth: Payer: Self-pay | Admitting: Cardiovascular Disease

## 2021-01-10 NOTE — Telephone Encounter (Signed)
Returned call to patient who states that she was recently started on Furosemide 20mg  daily by her PCP for lower extremity swelling. Patient states that she has recently started experiencing swelling in her feet and ankles that is more prominent in the afternoon/evening time. Patient denies any shortness of breath, chest pain, dizziness or any other symptoms at this time. Patient unable to provide blood pressure readings for today but recent BP readings are as follows:  01/07/21: 137/71         90                  136/70        71                 132/56         71  01/02/21: 159/75                 151/73                 147/73         74  12/29/20: 124/58         57                  121/57        56                  121/59        65  Patient also unable to provide recent weights but denies any recent weight gain. Patient would like to make an appointment to be seen by Dr. Sallyanne Kuster. Advised patient that there was no appointment availability for the next few weeks. Offered patient an appointment with APP but patient declined. Patient states she feels well and is okay with waiting for an appointment. Appointment made with Dr. Sallyanne Kuster for 3/30 at 3:20pm.   Advised patient to elevate legs to help with swelling, as well as keeping a daily weight log and daily BP log. Patient verbalized understanding.   Will forward message to Dr. Loletha Grayer &  Lattie Haw to make aware.

## 2021-01-10 NOTE — Telephone Encounter (Signed)
Pt c/o BP issue: STAT if pt c/o blurred vision, one-sided weakness or slurred speech  1. What are your last 5 BP readings?   01/07/21: 137/71 90       136/70 71      132/56 71  01/02/21: 159/75      151/73      147/73 74  12/29/20: 124/58 57       121/57 56       121/59 65        2. Are you having any other symptoms (ex. Dizziness, headache, blurred vision, passed out)? No   3. What is your BP issue?  Patient states her BP has been fluctuating.  Pt c/o swelling: STAT is pt has developed SOB within 24 hours  1) How much weight have you gained and in what time span? No noticeable weight gain  2) If swelling, where is the swelling located? Ankles and legs  3) Are you currently taking a fluid pill? Yes, Furosemide 20 MG (states she began taking it 1 week ago, per Dr. Felipa Eth)  4) Are you currently SOB? No   5) Do you have a log of your daily weights (if so, list)? No   6) Have you gained 3 pounds in a day or 5 pounds in a week? No   7) Have you traveled recently? No

## 2021-01-10 NOTE — Telephone Encounter (Signed)
Agree with those recommendations. OK to continue that low dose of furosemide. Thanks.

## 2021-02-28 ENCOUNTER — Other Ambulatory Visit: Payer: Self-pay

## 2021-02-28 ENCOUNTER — Ambulatory Visit (INDEPENDENT_AMBULATORY_CARE_PROVIDER_SITE_OTHER): Payer: Medicare HMO | Admitting: Cardiovascular Disease

## 2021-02-28 ENCOUNTER — Encounter: Payer: Self-pay | Admitting: Cardiovascular Disease

## 2021-02-28 VITALS — BP 180/90 | HR 71 | Ht 62.0 in | Wt 166.8 lb

## 2021-02-28 DIAGNOSIS — R011 Cardiac murmur, unspecified: Secondary | ICD-10-CM | POA: Diagnosis not present

## 2021-02-28 DIAGNOSIS — I10 Essential (primary) hypertension: Secondary | ICD-10-CM

## 2021-02-28 MED ORDER — HYDROCHLOROTHIAZIDE 12.5 MG PO CAPS: 12.5000 mg | ORAL_CAPSULE | Freq: Every day | ORAL | 4 refills | Status: DC

## 2021-02-28 MED ORDER — DILTIAZEM HCL ER COATED BEADS 120 MG PO CP24: 120.0000 mg | ORAL_CAPSULE | Freq: Every day | ORAL | 3 refills | Status: DC

## 2021-02-28 NOTE — Patient Instructions (Signed)
Medication Instructions:  FUROSEMIDE: Take one 40 mg tablet only as needed for swelling INCREASE the Hydrochlorothiazide to 12.5 mg daily DECREASE the Diltiazem to 120 mg once daily  *If you need a refill on your cardiac medications before your next appointment, please call your pharmacy*   Lab Work: Your provider would like for you to return in one week to have the following labs drawn: BMET. You do not need an appointment for the lab. Once in our office lobby there is a podium where you can sign in and ring the doorbell to alert [place] that you are here. The lab is open from 8:00 am to 4:30 pm; closed for lunch from 12:45pm-1:45pm.  If you have labs (blood work) drawn today and your tests are completely normal, you will receive your results only by: Marland Kitchen MyChart Message (if you have MyChart) OR . A paper copy in the mail If you have any lab test that is abnormal or we need to change your treatment, we will call you to review the results.   Testing/Procedures: Your physician has requested that you have an echocardiogram. Echocardiography is a painless test that uses sound waves to create images of your heart. It provides your doctor with information about the size and shape of your heart and how well your heart's chambers and valves are working. You may receive an ultrasound enhancing agent through an IV if needed to better visualize your heart during the echo.This procedure takes approximately one hour. There are no restrictions for this procedure. This will take place at the [address].     Follow-Up: At [facility], you and your health needs are our priority.  As part of our continuing mission to provide you with exceptional heart care, we have created designated Provider Care Teams.  These Care Teams include your primary Cardiologist (physician) and Advanced Practice Providers (APPs -  Physician Assistants and Nurse Practitioners) who all work together to provide you with  the care you need, when you need it.  We recommend signing up for the patient portal called "MyChart".  Sign up information is provided on this After Visit Summary.  MyChart is used to connect with patients for Virtual Visits (Telemedicine).  Patients are able to view lab/test results, encounter notes, upcoming appointments, etc.  Non-urgent messages can be sent to your provider as well.   To learn more about what you can do with MyChart, go to [email or web address].    Your next appointment:   4-6 week(s)  The format for your next appointment:   In Person  Provider:   You may see Sanda Klein, MD or one of the following Advanced Practice Providers on your designated Care Team:    Almyra Deforest, PA-C  Fabian Sharp, [place] or   Roby Lofts, [place]    Other Instructions Dr. Sallyanne Kuster would like you to check your blood pressure daily for the next 2 weeks.  Keep a journal of these daily blood pressure and heart rate readings and call our office or send a message through [place] with the results. Thank you!  It is best to check your BP 1-2 hours after taking your medications to see the medications effectiveness on your BP.    Here are some tips that our clinical pharmacists share for home BP monitoring:          Rest 10 minutes before taking your blood pressure.          Don't smoke or drink caffeinated  beverages for at least 30 minutes before.          Take your blood pressure before (not after) you eat.          Sit comfortably with your back supported and both feet on the floor (don't cross your legs).          Elevate your arm to heart level on a table or a desk.          Use the proper sized cuff. It should fit smoothly and snugly around your bare upper arm. There should be enough room to slip a fingertip under the cuff. The bottom edge of the cuff should be 1 inch above the crease of the elbow.

## 2021-02-28 NOTE — Progress Notes (Signed)
Cardiology Office Note:    Date:  03/01/2021   ID:  Shelley [name], DOB 1932-10-02, MRN [id number]  PCP:  Lajean Manes, MD  Cardiologist:  No primary care provider on file.   Referring MD: Lajean Manes, MD   Chief Complaint  Patient presents with  . Hypertension  Hyperlipidemia, HTN  History of Present Illness:    Shelley [name] is a 87 y.o. female with a hx of hypothyroidism, hypertension and hyperlipidemia.  She always accompanied her late husband, [name] to his office appointments; he passed away about 4 years ago.  She continues to be troubled by anxiety, ever since her husband passed away and she had to move out of their old house.  Nevertheless she remains well engaged socially and does not appear to have depression.  She has been periodically checking her blood pressure and for the most part this has been in the high 130s-140s/70s, but in the last few days her blood pressure has been markedly elevated at around 180/90.  Her edema is worse than usual.  It appeared to worsen when her diltiazem dose was increased from 100 2280 mg daily.  She is trying not to eat any salty foods and does not add salt to her meals.  She has been taking losartan 100 mg daily and diltiazem 180 mg daily as well as furosemide 40 mg daily for lower extremity edema.  Hydrochlorothiazide has been on hold since she had her brief syncopal event last spring (she was probably hypovolemic at the time).  The patient specifically denies any chest pain at rest exertion, dyspnea at rest or with exertion, orthopnea, paroxysmal nocturnal dyspnea, syncope, palpitations, focal neurological deficits, intermittent claudication, unexplained weight gain, cough, hemoptysis or wheezing.   A few months ago she moved out of the house that she shared with her husband then she has been very anxious ever since.  She loves her new apartment and remains socially engaged, but nevertheless feels uneasy.  Just before  moving, she had a fall with a pattern suggesting orthostatic hypotension, may be related to relative hypovolemia.  She had a bad leg cramp and try to get out of bed to walk it off after which she fell.  It sounds like she briefly lost consciousness.  CT head was okay.  She was found to have abnormalities in her electrolytes and hydrochlorothiazide was discontinued.  Since then she has had occasional problems with mild ankle edema and her blood pressure is consistently in the 140s or 150s.  Follow-up labs on 04/24/2020 showed normal creatinine of 0.83 and normal potassium 4.2.       Past Medical History:  Diagnosis Date  . Hyperlipidemia   . Hypertension   . Thyroid disease     History reviewed. No pertinent surgical history.  Current Medications: Current Meds  Medication Sig  . busPIRone (BUSPAR) 10 MG tablet Take 1 tablet by mouth 3 (three) times daily.   . calcium carbonate (OS-CAL) 600 MG TABS tablet Take 600 mg by mouth daily with breakfast.  . Cholecalciferol 25 MCG (1000 UT) capsule Take 1,000 Units by mouth daily.  . Coenzyme Q10 (CO Q 10) 60 MG CAPS See admin instructions.  . furosemide (LASIX) 40 MG tablet Take 40 mg by mouth as needed for edema.  Marland Kitchen levothyroxine (SYNTHROID, LEVOTHROID) 75 MCG tablet Take 88 mcg by mouth daily before breakfast.  . losartan (COZAAR) 100 MG tablet Take 100 mg by mouth daily.  . Omega 3 1000 MG CAPS Take  1 capsule by mouth daily.  Marland Kitchen omeprazole (PRILOSEC) 40 MG capsule Take 40 mg by mouth as directed.  . simvastatin (ZOCOR) 40 MG tablet Take 40 mg by mouth daily.  . traZODone (DESYREL) 50 MG tablet Take 25 mg by mouth at bedtime.   . [DISCONTINUED] diltiazem (CARDIZEM CD) 180 MG 24 hr capsule Take 180 mg by mouth daily.     Allergies:   Patient has no known allergies.   Social History   Socioeconomic History  . Marital status: Married    Spouse name: Not on file  . Number of children: Not on file  . Years of education: Not on file  .  Highest education level: Not on file  Occupational History  . Not on file  Tobacco Use  . Smoking status: Never Smoker  . Smokeless tobacco: Never Used  Substance and Sexual Activity  . Alcohol use: Not on file  . Drug use: Not on file  . Sexual activity: Not on file  Other Topics Concern  . Not on file  Social History Narrative  . Not on file   Social Determinants of Health   Financial Resource Strain: Not on file  Food Insecurity: Not on file  Transportation Needs: Not on file  Physical Activity: Not on file  Stress: Not on file  Social Connections: Not on file     Family History: Negative for early onset coronary or vascular disease ROS:   Please see the history of present illness.    All other systems are reviewed and are negative.   EKGs/Labs/Other Studies Reviewed:    The following studies were reviewed today: CT head from April 2021 and subsequent labs  EKG:  EKG is  ordered today.  It shows normal sinus rhythm and is essentially normal tracing with the exception of questionable left atrial abnormality.  The QTC is 454 ms. Recent Labs: 02/14/2021 Creatinine 1.08, potassium 4.2 10/31/2020 Hemoglobin 14.0 Recent Lipid Panel 10/31/2020 Total cholesterol 187, HDL 58, LDL 97, triglycerides 185  Physical Exam:    VS:  BP (!) 180/90   Pulse 71   Ht 5\' 2"  (1.575 m)   Wt 166 lb 12.8 oz (75.7 kg)   BMI 30.51 kg/m     Wt Readings from Last 3 Encounters:  02/28/21 166 lb 12.8 oz (75.7 kg)  07/12/20 155 lb 9.6 oz (70.6 kg)  07/13/19 167 lb 9.6 oz (76 kg)      General: Alert, oriented x3, no distress, appears well Head: no evidence of trauma, PERRL, EOMI, no exophtalmos or lid lag, no myxedema, no xanthelasma; normal ears, nose and oropharynx Neck: normal jugular venous pulsations and no hepatojugular reflux; brisk carotid pulses without delay and no carotid bruits Chest: clear to auscultation, no signs of consolidation by percussion or palpation, normal  fremitus, symmetrical and full respiratory excursions Cardiovascular: normal position and quality of the apical impulse, regular rhythm, normal first and second heart sounds, 2/6 early peaking systolic ejection murmur in the aortic focus no diastolic murmurs, rubs or gallops Abdomen: no tenderness or distention, no masses by palpation, no abnormal pulsatility or arterial bruits, normal bowel sounds, no hepatosplenomegaly Extremities: no clubbing, cyanosis, symmetrical 2+ pitting edema of the feet and ankles; 2+ radial, ulnar and brachial pulses bilaterally; 2+ right femoral, posterior tibial and dorsalis pedis pulses; 2+ left femoral, posterior tibial and dorsalis pedis pulses; no subclavian or femoral bruits Neurological: grossly nonfocal Psych: Normal mood and affect    ASSESSMENT:    1. Essential  hypertension   2. Murmur    PLAN:    In order of problems listed above:  1. HTN: Off hydrochlorothiazide following a fall that was likely due to orthostatic hypotension.  Increasing doses of calcium channel blockers have led to problems with edema and her blood pressure is not well controlled.  I think she will need the thiazide diuretic back for blood pressure control.  (My suspicion is that her previous episode of syncope in her home was not due to the thiazide diuretic alone, but occurred in the context of the recent demise of her husband, moving into a new home, reduced oral intake).  We will cut the diltiazem dose back down to 120 mg daily and continue the losartan 100 mg daily.  Recheck a basic metabolic panel in a week.  Advise [place] to send [place] a log of her blood pressure readings via MyChart in about 2 weeks since it will take that long for the new medication regimen to reach a steady state. 2. 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  Since then, the murmur has changed in quality, we will repeat her echocardiogram.    Medication  Adjustments/Labs and Tests Ordered: Current medicines are reviewed at length with the patient today.  Concerns regarding medicines are outlined above.  Orders Placed This Encounter  Procedures  . Basic metabolic panel  . EKG 12-Lead  . ECHOCARDIOGRAM COMPLETE   Meds ordered this encounter  Medications  . diltiazem (CARDIZEM CD) 120 MG 24 hr capsule    Sig: Take 1 capsule (120 mg total) by mouth daily.    Dispense:  30 capsule    Refill:  3  . hydrochlorothiazide (MICROZIDE) 12.5 MG capsule    Sig: Take 1 capsule (12.5 mg total) by mouth daily.    Dispense:  30 capsule    Refill:  4    Patient Instructions  Medication Instructions:  FUROSEMIDE: Take one 40 mg tablet only as needed for swelling INCREASE the Hydrochlorothiazide to 12.5 mg daily DECREASE the Diltiazem to 120 mg once daily  *If you need a refill on your cardiac medications before your next appointment, please call your pharmacy*   Lab Work: Your provider would like for you to return in one week to have the following labs drawn: BMET. You do not need an appointment for the lab. Once in our office lobby there is a podium where you can sign in and ring the doorbell to alert [place] that you are here. The lab is open from 8:00 am to 4:30 pm; closed for lunch from 12:45pm-1:45pm.  If you have labs (blood work) drawn today and your tests are completely normal, you will receive your results only by: Marland Kitchen MyChart Message (if you have MyChart) OR . A paper copy in the mail If you have any lab test that is abnormal or we need to change your treatment, we will call you to review the results.   Testing/Procedures: Your physician has requested that you have an echocardiogram. Echocardiography is a painless test that uses sound waves to create images of your heart. It provides your doctor with information about the size and shape of your heart and how well your heart's chambers and valves are working. You may receive an ultrasound  enhancing agent through an IV if needed to better visualize your heart during the echo.This procedure takes approximately one hour. There are no restrictions for this procedure. This will take place at  the [address].     Follow-Up: At [facility], you and your health needs are our priority.  As part of our continuing mission to provide you with exceptional heart care, we have created designated Provider Care Teams.  These Care Teams include your primary Cardiologist (physician) and Advanced Practice Providers (APPs -  Physician Assistants and Nurse Practitioners) who all work together to provide you with the care you need, when you need it.  We recommend signing up for the patient portal called "MyChart".  Sign up information is provided on this After Visit Summary.  MyChart is used to connect with patients for Virtual Visits (Telemedicine).  Patients are able to view lab/test results, encounter notes, upcoming appointments, etc.  Non-urgent messages can be sent to your provider as well.   To learn more about what you can do with MyChart, go to [email or web address].    Your next appointment:   4-6 week(s)  The format for your next appointment:   In Person  Provider:   You may see Sanda Klein, MD or one of the following Advanced Practice Providers on your designated Care Team:    Almyra Deforest, PA-C  Fabian Sharp, [place] or   Roby Lofts, [place]    Other Instructions Dr. Sallyanne Kuster would like you to check your blood pressure daily for the next 2 weeks.  Keep a journal of these daily blood pressure and heart rate readings and call our office or send a message through [place] with the results. Thank you!  It is best to check your BP 1-2 hours after taking your medications to see the medications effectiveness on your BP.    Here are some tips that our clinical pharmacists share for home BP monitoring:          Rest 10 minutes before taking your blood pressure.           Don't smoke or drink caffeinated beverages for at least 30 minutes before.          Take your blood pressure before (not after) you eat.          Sit comfortably with your back supported and both feet on the floor (don't cross your legs).          Elevate your arm to heart level on a table or a desk.          Use the proper sized cuff. It should fit smoothly and snugly around your bare upper arm. There should be enough room to slip a fingertip under the cuff. The bottom edge of the cuff should be 1 inch above the crease of the elbow.      Signed, Sanda Klein, MD  03/01/2021 2:39 PM    [facility]

## 2021-03-01 ENCOUNTER — Encounter: Payer: Self-pay | Admitting: Cardiovascular Disease

## 2021-03-08 LAB — BASIC METABOLIC PANEL
BUN/Creatinine Ratio: 13 (ref 12–28)
BUN: 14 mg/dL (ref 8–27)
CO2: 24 mmol/L (ref 20–29)
Calcium: 9.5 mg/dL (ref 8.7–10.3)
Chloride: 94 mmol/L — ABNORMAL LOW (ref 96–106)
Creatinine, Ser: 1.08 mg/dL — ABNORMAL HIGH (ref 0.57–1.00)
Glucose: 97 mg/dL (ref 65–99)
Potassium: 4.1 mmol/L (ref 3.5–5.2)
Sodium: 133 mmol/L — ABNORMAL LOW (ref 134–144)
eGFR: 50 mL/min/{1.73_m2} — ABNORMAL LOW (ref >59)

## 2021-03-12 ENCOUNTER — Telehealth: Payer: Self-pay | Admitting: Cardiovascular Disease

## 2021-03-12 NOTE — Telephone Encounter (Signed)
Patient called and stated that she is very concerned about her blood pressure and wants to know if it could be changed. Please call patient.

## 2021-03-12 NOTE — Telephone Encounter (Signed)
Spoke to patient she was calling for 4/6 bmet results.Advised bmet was ok.

## 2021-04-02 ENCOUNTER — Ambulatory Visit (HOSPITAL_COMMUNITY): Payer: Medicare HMO | Attending: Cardiovascular Disease

## 2021-04-02 ENCOUNTER — Other Ambulatory Visit: Payer: Self-pay

## 2021-04-02 DIAGNOSIS — I10 Essential (primary) hypertension: Secondary | ICD-10-CM | POA: Diagnosis not present

## 2021-04-02 DIAGNOSIS — R011 Cardiac murmur, unspecified: Secondary | ICD-10-CM | POA: Insufficient documentation

## 2021-04-02 LAB — ECHOCARDIOGRAM COMPLETE
AR max vel: 0.89 cm2
AV Area VTI: 0.83 cm2
AV Area mean vel: 0.85 cm2
AV Mean grad: 21 mmHg
AV Peak grad: 36 mmHg
Ao pk vel: 3 m/s
Area-P 1/2: 4.21 cm2
S' Lateral: 2.7 cm

## 2021-04-04 ENCOUNTER — Ambulatory Visit (INDEPENDENT_AMBULATORY_CARE_PROVIDER_SITE_OTHER): Payer: Medicare HMO | Admitting: Cardiovascular Disease

## 2021-04-04 ENCOUNTER — Other Ambulatory Visit: Payer: Self-pay

## 2021-04-04 ENCOUNTER — Encounter: Payer: Self-pay | Admitting: Cardiovascular Disease

## 2021-04-04 VITALS — BP 127/66 | HR 61 | Ht 61.0 in | Wt 165.0 lb

## 2021-04-04 DIAGNOSIS — I10 Essential (primary) hypertension: Secondary | ICD-10-CM | POA: Diagnosis not present

## 2021-04-04 DIAGNOSIS — I350 Nonrheumatic aortic (valve) stenosis: Secondary | ICD-10-CM

## 2021-04-04 DIAGNOSIS — E782 Mixed hyperlipidemia: Secondary | ICD-10-CM | POA: Diagnosis not present

## 2021-04-04 NOTE — Patient Instructions (Addendum)

## 2021-04-04 NOTE — Progress Notes (Signed)
Cardiology Office Note:    Date:  04/07/2021   ID:  Shelley [name], DOB 02-Dec-1932, MRN [id number]  PCP:  Lajean Manes, MD  Cardiologist:  Sanda Klein, MD   Referring MD: Lajean Manes, MD   No chief complaint on file. Hyperlipidemia, HTN  History of Present Illness:    Shelley [name] is a 87 y.o. female with a hx of hypothyroidism, hypertension and hyperlipidemia.  She always accompanied her late husband, [name] to his office appointments; he passed away about 4 years ago.  Today she is accompanied to the office by one of her daughters.  Overall she is doing well and is managing all her affairs independently.  She continues to have occasional problems with anxiety.  She has been monitoring her blood pressure carefully and usually her systolic blood pressures in the 409W and her diastolic blood pressure in the low 60s.  She occasionally has high systolic blood pressure readings up to 160, but she also has frequent episodes when her diastolic blood pressure drops into the 50s.  She has not had dizziness, syncope or falls.  She denies palpitations,  orthopnea, PND or chest discomfort either at rest or with exertion.  She has intermittent issues with leg edema, but this is a little better since we reduce her dose of diltiazem.  She had a remote syncopal episode when she was probably dehydrated and her hydrochlorothiazide was on hold, but we restarted it at her last appointment.  She has not had any new episodes of orthostatic hypotension or dizziness or syncope.  Her echocardiogram performed on 04/02/2021 showed progression of her aortic stenosis which is now moderate (mean gradient 21, dimensionless valve index 0.29).  A few months ago she moved out of the house that she shared with her husband then she has been very anxious ever since.  She loves her new apartment and remains socially engaged, but nevertheless feels uneasy.  Just before moving, she had a fall with a  pattern suggesting orthostatic hypotension, may be related to relative hypovolemia.  She had a bad leg cramp and try to get out of bed to walk it off after which she fell.  It sounds like she briefly lost consciousness.  CT head was okay.  She was found to have abnormalities in her electrolytes and hydrochlorothiazide was discontinued.  Since then she has had occasional problems with mild ankle edema and her blood pressure is consistently in the 140s or 150s.  Follow-up labs on 04/24/2020 showed normal creatinine of 0.83 and normal potassium 4.2.       Past Medical History:  Diagnosis Date  . Hyperlipidemia   . Hypertension   . Thyroid disease     No past surgical history on file.  Current Medications: Current Meds  Medication Sig  . busPIRone (BUSPAR) 10 MG tablet Take 1 tablet by mouth 3 (three) times daily.   . calcium carbonate (OS-CAL) 600 MG TABS tablet Take 600 mg by mouth daily with breakfast.  . Cholecalciferol 25 MCG (1000 UT) capsule Take 1,000 Units by mouth daily.  . Coenzyme Q10 (CO Q 10) 60 MG CAPS See admin instructions.  Marland Kitchen diltiazem (CARDIZEM CD) 120 MG 24 hr capsule Take 1 capsule (120 mg total) by mouth daily.  . furosemide (LASIX) 40 MG tablet Take 40 mg by mouth as needed for edema.  . hydrochlorothiazide (MICROZIDE) 12.5 MG capsule Take 1 capsule (12.5 mg total) by mouth daily.  Marland Kitchen levothyroxine (SYNTHROID, LEVOTHROID) 75 MCG tablet Take 88  mcg by mouth daily before breakfast.  . losartan (COZAAR) 100 MG tablet Take 100 mg by mouth daily.  . Omega 3 1000 MG CAPS Take 1 capsule by mouth daily.  Marland Kitchen omeprazole (PRILOSEC) 40 MG capsule Take 40 mg by mouth as directed.  . simvastatin (ZOCOR) 40 MG tablet Take 40 mg by mouth daily.  . traZODone (DESYREL) 50 MG tablet Take 25 mg by mouth at bedtime.      Allergies:   Patient has no known allergies.   Social History   Socioeconomic History  . Marital status: Married    Spouse name: Not on file  . Number of children:  Not on file  . Years of education: Not on file  . Highest education level: Not on file  Occupational History  . Not on file  Tobacco Use  . Smoking status: Never Smoker  . Smokeless tobacco: Never Used  Substance and Sexual Activity  . Alcohol use: Not on file  . Drug use: Not on file  . Sexual activity: Not on file  Other Topics Concern  . Not on file  Social History Narrative  . Not on file   Social Determinants of Health   Financial Resource Strain: Not on file  Food Insecurity: Not on file  Transportation Needs: Not on file  Physical Activity: Not on file  Stress: Not on file  Social Connections: Not on file     Family History: Negative for early onset coronary or vascular disease ROS:   Please see the history of present illness.    All other systems are reviewed and are negative.   EKGs/Labs/Other Studies Reviewed:    The following studies were reviewed today: Echocardiogram 04/02/2021 1. Left ventricular ejection fraction, by estimation, is 65 to 70%. The  left ventricle has normal function. The left ventricle has no regional  wall motion abnormalities. There is moderate left ventricular hypertrophy  of the basal-septal segment. Left  ventricular diastolic parameters are consistent with Grade I diastolic  dysfunction (impaired relaxation). Elevated left ventricular end-diastolic  pressure. The average left ventricular global longitudinal strain is -16.4  %. The global longitudinal strain  is abnormal.  2. Right ventricular systolic function is normal. The right ventricular  size is normal. There is normal pulmonary artery systolic pressure.  3. The mitral valve is normal in structure. Trivial mitral valve  regurgitation. No evidence of mitral stenosis.  4. Tricuspid valve regurgitation is mild to moderate.  5. The aortic valve is tricuspid. There is moderate calcification of the  aortic valve. There is moderate thickening of the aortic valve. Aortic   valve regurgitation is not visualized. Mild to moderate aortic valve  stenosis. Aortic valve area, by VTI  measures 0.83 cm. Aortic valve mean gradient measures 21.0 mmHg. Aortic  valve Vmax measures 3.00 m/s.  6. The inferior vena cava is normal in size with greater than 50%  respiratory variability, suggesting right atrial pressure of 3 mmHg.   EKG:  EKG is not ordered today.  The tracing from 02/28/2021 shows normal sinus rhythm and is essentially normal tracing with the exception of questionable left atrial abnormality.  The QTC is 454 ms. Recent Labs: 02/14/2021 Creatinine 1.08, potassium 4.2 10/31/2020 Hemoglobin 14.0 Recent Lipid Panel 10/31/2020 Total cholesterol 187, HDL 58, LDL 97, triglycerides 185  Physical Exam:    VS:  BP 127/66   Pulse 61   Ht 5\' 1"  (1.549 m)   Wt 165 lb (74.8 kg)   SpO2 100%  BMI 31.18 kg/m     Wt Readings from Last 3 Encounters:  04/04/21 165 lb (74.8 kg)  02/28/21 166 lb 12.8 oz (75.7 kg)  07/12/20 155 lb 9.6 oz (70.6 kg)     General: Alert, oriented x3, no distress, borderline obese Head: no evidence of trauma, PERRL, EOMI, no exophtalmos or lid lag, no myxedema, no xanthelasma; normal ears, nose and oropharynx Neck: normal jugular venous pulsations and no hepatojugular reflux; brisk carotid pulses without delay and no carotid bruits Chest: clear to auscultation, no signs of consolidation by percussion or palpation, normal fremitus, symmetrical and full respiratory excursions Cardiovascular: normal position and quality of the apical impulse, regular rhythm, normal first and distinct second heart sounds, 2-3/6 early to mid peaking systolic ejection murmur no diastolic murmurs, rubs or gallops Abdomen: no tenderness or distention, no masses by palpation, no abnormal pulsatility or arterial bruits, normal bowel sounds, no hepatosplenomegaly Extremities: no clubbing, cyanosis; she has prominent varicose vein as well as 1+ symmetrical ankle  edema; 2+ radial, ulnar and brachial pulses bilaterally; 2+ right femoral, posterior tibial and dorsalis pedis pulses; 2+ left femoral, posterior tibial and dorsalis pedis pulses; no subclavian or femoral bruits Neurological: grossly nonfocal Psych: Normal mood and affect    ASSESSMENT:    1. Essential hypertension   2. Nonrheumatic aortic (valve) stenosis   3. Mixed hyperlipidemia    PLAN:    In order of problems listed above:  1. HTN: Blood pressure control is not always ideal, primarily since she has a rather broad pulse pressure.  Would avoid increasing medications since this could cause diastolic blood pressures less than 60 and she has a history of orthostatic syncope.  Think we will continue the current medical regimen.  As long as the edema resolves after overnight rest, I would not increase her diuretic. 2. AS: Asymptomatic moderate aortic stenosis.  It is possible that we will have to discuss aortic valve replacement (probably TAVR) in the next 2-5 years.  At this point she has no symptoms of exertional angina/exertional syncope or exertional dyspnea.  We will repeat the echocardiogram every 12 months. 3. HLP: Most recent lipid profile shows satisfactory values for patient without known CAD or PAD.  Just slightly elevated triglycerides.  Continue simvastatin, try to lose some weight.    Medication Adjustments/Labs and Tests Ordered: Current medicines are reviewed at length with the patient today.  Concerns regarding medicines are outlined above.  No orders of the defined types were placed in this encounter.  No orders of the defined types were placed in this encounter.   Patient Instructions  Medication Instructions:  No changes *If you need a refill on your cardiac medications before your next appointment, please call your pharmacy*   Lab Work: None ordered If you have labs (blood work) drawn today and your tests are completely normal, you will receive your results  only by: Marland Kitchen MyChart Message (if you have MyChart) OR . A paper copy in the mail If you have any lab test that is abnormal or we need to change your treatment, we will call you to review the results.   Testing/Procedures: None ordered   Follow-Up: At [facility], you and your health needs are our priority.  As part of our continuing mission to provide you with exceptional heart care, we have created designated Provider Care Teams.  These Care Teams include your primary Cardiologist (physician) and Advanced Practice Providers (APPs -  Physician Assistants and Nurse Practitioners) who all work  together to provide you with the care you need, when you need it.  We recommend signing up for the patient portal called "MyChart".  Sign up information is provided on this After Visit Summary.  MyChart is used to connect with patients for Virtual Visits (Telemedicine).  Patients are able to view lab/test results, encounter notes, upcoming appointments, etc.  Non-urgent messages can be sent to your provider as well.   To learn more about what you can do with MyChart, go to [email or web address].    Your next appointment:   6 month(s)  The format for your next appointment:   In Person  Provider:   You may see Sanda Klein, MD or one of the following Advanced Practice Providers on your designated Care Team:    Almyra Deforest, PA-C  Fabian Sharp, [place] or   Roby Lofts, PA-C       Signed, Sanda Klein, MD  04/07/2021 3:20 PM    [place]

## 2021-04-07 ENCOUNTER — Encounter: Payer: Self-pay | Admitting: Cardiovascular Disease

## 2021-05-02 ENCOUNTER — Other Ambulatory Visit: Payer: Self-pay

## 2021-05-02 MED ORDER — HYDROCHLOROTHIAZIDE 12.5 MG PO CAPS: 12.5000 mg | ORAL_CAPSULE | Freq: Every day | ORAL | 4 refills | Status: DC

## 2021-06-26 ENCOUNTER — Other Ambulatory Visit: Payer: Self-pay

## 2021-06-26 MED ORDER — DILTIAZEM HCL ER COATED BEADS 120 MG PO CP24: 120.0000 mg | ORAL_CAPSULE | Freq: Every day | ORAL | 2 refills | Status: DC

## 2021-07-06 ENCOUNTER — Other Ambulatory Visit: Payer: Self-pay

## 2021-07-06 MED ORDER — DILTIAZEM HCL ER COATED BEADS 120 MG PO CP24: 120.0000 mg | ORAL_CAPSULE | Freq: Every day | ORAL | 3 refills | Status: DC

## 2021-07-09 ENCOUNTER — Telehealth: Payer: Self-pay | Admitting: Cardiovascular Disease

## 2021-07-09 NOTE — Telephone Encounter (Signed)
Pt c/o medication issue:  1. Name of Medication:  diltiazem (CARDIZEM CD) 120 MG 24 hr capsule simvastatin (ZOCOR) 40 MG tablet  2. How are you currently taking this medication (dosage and times per day)? Patient has not taken the Diltiazem yet. Patient is taking the simvastatin   3. Are you having a reaction (difficulty breathing--STAT)?   4. What is your medication issue? Pharmacist called to report a drug interaction between these two medications.   [place] number provided [phone]) will go directly to a pharmacist. Please give the pharmacist the invoice # when referencing the call   Invoice [id number]

## 2021-07-10 ENCOUNTER — Telehealth: Payer: Self-pay | Admitting: Cardiovascular Disease

## 2021-07-10 ENCOUNTER — Other Ambulatory Visit: Payer: Self-pay

## 2021-07-10 MED ORDER — ROSUVASTATIN CALCIUM 20 MG PO TABS: 20.0000 mg | ORAL_TABLET | Freq: Every day | ORAL | 3 refills | Status: DC

## 2021-07-10 MED ORDER — HYDROCHLOROTHIAZIDE 12.5 MG PO CAPS: 12.5000 mg | ORAL_CAPSULE | Freq: Every day | ORAL | 4 refills | Status: DC

## 2021-07-10 NOTE — Telephone Encounter (Signed)
*  STAT* If patient is at the pharmacy, call can be transferred to refill team.   1. Which medications need to be refilled? (please list name of each medication and dose if known) Diltiazem 120 mg Hydrochlorothiazide   2. Which pharmacy/location (including street and city if local pharmacy) is medication to be sent to? [facility]   3. Do they need a 30 day or 90 day supply? 90 days    Medication need to e sent this pharmacy

## 2021-07-10 NOTE — Telephone Encounter (Signed)
Patient has been on both medications without any sign of myalgias.  However, because she is now 66, I would prefer to switch her to a different statin to be sure that this does not become an issue.  Will have her continue diltiazem 120 mg daily and start rosuvastatin 20 mg daily.    Explained change to patient, she is agreeable to the change.  Spoke with pharmacy and prescription has been switched.

## 2021-07-11 ENCOUNTER — Other Ambulatory Visit: Payer: Self-pay | Admitting: *Deleted

## 2021-11-23 ENCOUNTER — Other Ambulatory Visit: Payer: Self-pay

## 2021-11-23 ENCOUNTER — Encounter: Payer: Self-pay | Admitting: Cardiovascular Disease

## 2021-11-23 ENCOUNTER — Ambulatory Visit: Payer: Medicare HMO | Admitting: Cardiovascular Disease

## 2021-11-23 ENCOUNTER — Ambulatory Visit (INDEPENDENT_AMBULATORY_CARE_PROVIDER_SITE_OTHER): Payer: Medicare HMO | Admitting: Cardiovascular Disease

## 2021-11-23 VITALS — BP 151/81 | HR 64 | Ht 60.0 in | Wt 162.4 lb

## 2021-11-23 DIAGNOSIS — I350 Nonrheumatic aortic (valve) stenosis: Secondary | ICD-10-CM | POA: Diagnosis not present

## 2021-11-23 NOTE — Patient Instructions (Signed)
Medication Instructions:  No changes *If you need a refill on your cardiac medications before your next appointment, please call your pharmacy*   Lab Work: None ordered If you have labs (blood work) drawn today and your tests are completely normal, you will receive your results only by: [place] (if you have MyChart) OR A paper copy in the mail If you have any lab test that is abnormal or we need to change your treatment, we will call you to review the results.   Testing/Procedures: Your physician has requested that you have an echocardiogram in May 2023. Echocardiography is a painless test that uses sound waves to create images of your heart. It provides your doctor with information about the size and shape of your heart and how well your hearts chambers and valves are working. You may receive an ultrasound enhancing agent through an IV if needed to better visualize your heart during the echo.This procedure takes approximately one hour. There are no restrictions for this procedure. This will take place at the [address], [address].     Follow-Up: At [facility], you and your health needs are our priority.  As part of our continuing mission to provide you with exceptional heart care, we have created designated Provider Care Teams.  These Care Teams include your primary Cardiologist (physician) and Advanced Practice Providers (APPs -  Physician Assistants and Nurse Practitioners) who all work together to provide you with the care you need, when you need it.  We recommend signing up for the patient portal called "MyChart".  Sign up information is provided on this After Visit Summary.  MyChart is used to connect with patients for Virtual Visits (Telemedicine).  Patients are able to view lab/test results, encounter notes, upcoming appointments, etc.  Non-urgent messages can be sent to your provider as well.   To learn more about what you can do with MyChart, go to  [email or web address].    Your next appointment:   6 month(s) after the echo  The format for your next appointment:   In Person  Provider:   Sanda Klein, MD

## 2021-11-23 NOTE — Progress Notes (Signed)
Cardiology Office Note:    Date:  11/23/2021   ID:  Shelley [name], DOB [birth year], MRN [id number]  PCP:  Shelley Manes, MD  Cardiologist:  Shelley Klein, MD   Referring MD: Shelley Manes, MD   No chief complaint on file. Hyperlipidemia, HTN  History of Present Illness:    Shelley [name] is a 87 y.o. female with a hx of moderate aortic stenosis hypothyroidism, hypertension and hyperlipidemia.  She always accompanied her late husband, Shelley [name] to his office appointments; he passed away about 4-5 years ago.    She is doing well.  She is very busy socially.  She typically wakes up around 5:00 in the morning, frequently goes to mass in the morning and then has coffee with friends, has a variety of other activities that keep her busy in the afternoon and never goes to bed before 1030 or 11 PM.  At the end of the day she will be "tired", but she denies exertional dyspnea, angina or exertional dizziness/syncope.  She has mild swelling in the ankles.  Her hydrochlorothiazide was stopped due to laboratory test abnormalities (she is not sure if it was her sodium or different electrolyte that was "too high").  In the past, we had to stop her hydrochlorothiazide when she had a period of dehydration and syncope.  Despite stopping the diuretic, her blood pressure has remained under control.  A week ago in the doctor's office her blood pressure was 127/75.  Her blood pressure is a little high today at 151/81, but she was in a big hurry to get here on time.  She has had some discoloration of her ankles and has varicose veins.  She has not had palpitations, dizziness or syncope.  She denies focal neurological events.  Her echocardiogram performed on 04/02/2021 showed progression of her aortic stenosis which is now moderate (mean gradient 21, dimensionless valve index 0.29).  A few months ago she moved out of the house that she shared with her husband then she has been very anxious ever since.   She loves her new apartment and remains socially engaged, but nevertheless feels uneasy.  Just before moving, she had a fall with a pattern suggesting orthostatic hypotension, may be related to relative hypovolemia.  She had a bad leg cramp and try to get out of bed to walk it off after which she fell.  It sounds like she briefly lost consciousness.  CT head was okay.  She was found to have abnormalities in her electrolytes and hydrochlorothiazide was discontinued.  Since then she has had occasional problems with mild ankle edema and her blood pressure is consistently in the 140s or 150s.  Follow-up labs on 04/24/2020 showed normal creatinine of 0.83 and normal potassium 4.2.       Past Medical History:  Diagnosis Date   Hyperlipidemia    Hypertension    Thyroid disease     No past surgical history on file.  Current Medications: Current Meds  Medication Sig   Biotin 5 MG CAPS 2 capsules   busPIRone (BUSPAR) 10 MG tablet Take 1 tablet by mouth 3 (three) times daily.    calcium carbonate (OS-CAL) 600 MG TABS tablet Take 600 mg by mouth daily with breakfast.   Cholecalciferol 25 MCG (1000 UT) capsule Take 1,000 Units by mouth daily.   Coenzyme Q10 (CO Q 10) 60 MG CAPS See admin instructions.   diltiazem (CARDIZEM CD) 120 MG 24 hr capsule Take 1 capsule (120 mg total) by mouth  daily.   levothyroxine (SYNTHROID, LEVOTHROID) 75 MCG tablet Take 88 mcg by mouth daily before breakfast.   losartan (COZAAR) 100 MG tablet Take 100 mg by mouth daily.   Omega 3 1000 MG CAPS Take 1 capsule by mouth daily.   omeprazole (PRILOSEC) 40 MG capsule Take 40 mg by mouth as directed.   rosuvastatin (CRESTOR) 20 MG tablet Take 1 tablet (20 mg total) by mouth daily.   traZODone (DESYREL) 50 MG tablet Take 25 mg by mouth at bedtime.      Allergies:   Patient has no known allergies.   Social History   Socioeconomic History   Marital status: Married    Spouse name: Not on file   Number of children: Not on  file   Years of education: Not on file   Highest education level: Not on file  Occupational History   Not on file  Tobacco Use   Smoking status: Never   Smokeless tobacco: Never  Substance and Sexual Activity   Alcohol use: Not on file   Drug use: Not on file   Sexual activity: Not on file  Other Topics Concern   Not on file  Social History Narrative   Not on file   Social Determinants of Health   Financial Resource Strain: Not on file  Food Insecurity: Not on file  Transportation Needs: Not on file  Physical Activity: Not on file  Stress: Not on file  Social Connections: Not on file     Family History: Negative for early onset coronary or vascular disease ROS:   Please see the history of present illness.    All other systems are reviewed and are negative.   EKGs/Labs/Other Studies Reviewed:    The following studies were reviewed today: Echocardiogram 04/02/2021 1. Left ventricular ejection fraction, by estimation, is 65 to 70%. The  left ventricle has normal function. The left ventricle has no regional  wall motion abnormalities. There is moderate left ventricular hypertrophy  of the basal-septal segment. Left  ventricular diastolic parameters are consistent with Grade I diastolic  dysfunction (impaired relaxation). Elevated left ventricular end-diastolic  pressure. The average left ventricular global longitudinal strain is -16.4  %. The global longitudinal strain   is abnormal.   2. Right ventricular systolic function is normal. The right ventricular  size is normal. There is normal pulmonary artery systolic pressure.   3. The mitral valve is normal in structure. Trivial mitral valve  regurgitation. No evidence of mitral stenosis.   4. Tricuspid valve regurgitation is mild to moderate.   5. The aortic valve is tricuspid. There is moderate calcification of the  aortic valve. There is moderate thickening of the aortic valve. Aortic  valve regurgitation is not  visualized. Mild to moderate aortic valve  stenosis. Aortic valve area, by VTI  measures 0.83 cm. Aortic valve mean gradient measures 21.0 mmHg. Aortic  valve Vmax measures 3.00 m/s.   6. The inferior vena cava is normal in size with greater than 50%  respiratory variability, suggesting right atrial pressure of 3 mmHg.   EKG:  EKG is not ordered today.  The tracing from 02/28/2021 shows normal sinus rhythm and is essentially normal tracing with the exception of questionable left atrial abnormality.  The QTC is 454 ms. Recent Labs: 11/09/2021 Hemoglobin 13.1, creatinine 0.6, potassium 4.6, ALT 14, TSH 0.54  Recent Lipid Panel 11/09/2021 Cholesterol 176, HDL 54, LDL 94, triglycerides 164  Physical Exam:    VS:  BP (!) 151/81  Pulse 64    Ht 5' (1.524 m)    Wt 162 lb 6.4 oz (73.7 kg)    LMP  (LMP Unknown)    SpO2 96%    BMI 31.72 kg/m     Wt Readings from Last 3 Encounters:  11/23/21 162 lb 6.4 oz (73.7 kg)  04/04/21 165 lb (74.8 kg)  02/28/21 166 lb 12.8 oz (75.7 kg)     General: Alert, oriented x3, no distress, mildly obese Head: no evidence of trauma, PERRL, EOMI, no exophtalmos or lid lag, no myxedema, no xanthelasma; normal ears, nose and oropharynx Neck: normal jugular venous pulsations and no hepatojugular reflux; brisk carotid pulses without delay and no carotid bruits Chest: clear to auscultation, no signs of consolidation by percussion or palpation, normal fremitus, symmetrical and full respiratory excursions Cardiovascular: normal position and quality of the apical impulse, regular rhythm, normal first and still distinct second heart sounds, 2-3/6 early to mid peaking aortic ejection murmur, no diastolic murmurs, rubs or gallops Abdomen: no tenderness or distention, no masses by palpation, no abnormal pulsatility or arterial bruits, normal bowel sounds, no hepatosplenomegaly Extremities: no clubbing, cyanosis, with trace ankle symmetrical edema; 2+ radial, ulnar and  brachial pulses bilaterally; 2+ right femoral, posterior tibial and dorsalis pedis pulses; 2+ left femoral, posterior tibial and dorsalis pedis pulses; no subclavian or femoral bruits Neurological: grossly nonfocal Psych: Normal mood and affect   ASSESSMENT:    No diagnosis found.  PLAN:    In order of problems listed above:  HTN: Had to stop her hydrochlorothiazide due to lab abnormalities.  Has also had issues with symptomatic orthostatic hypotension in the past.  Borderline elevated systolic blood pressure today, but normal systolic blood pressure just a week ago.  No changes made to her current medications.  We will get the labs from PCP. AS: Moderate by echo in May 2021 asymptomatic.  Repeat the echo in May 2022.  We will see her in the clinic to discuss the results of the follow-up echocardiogram. HLP: All lipid parameters from earlier this year are in target range.  Continue statin.    Medication Adjustments/Labs and Tests Ordered: Current medicines are reviewed at length with the patient today.  Concerns regarding medicines are outlined above.  No orders of the defined types were placed in this encounter.  No orders of the defined types were placed in this encounter.   There are no Patient Instructions on file for this visit.   Signed, Shelley Klein, MD  11/23/2021 10:35 AM    [place]

## 2022-02-08 ENCOUNTER — Telehealth: Payer: Self-pay | Admitting: Cardiovascular Disease

## 2022-02-08 NOTE — Telephone Encounter (Signed)
Returned call to patient no answer.Left message on personal voice mail to call back. 

## 2022-02-08 NOTE — Telephone Encounter (Signed)
Pt reaching out to speak w/ [facility] nurse in regard to fatigue.. please advise ?

## 2022-02-13 NOTE — Telephone Encounter (Signed)
Left message for pt to call.

## 2022-02-19 NOTE — Telephone Encounter (Signed)
LM2CB 

## 2022-03-07 ENCOUNTER — Telehealth: Payer: Self-pay | Admitting: Cardiology

## 2022-03-07 NOTE — Telephone Encounter (Signed)
Pt called in reporting her blood pressures have been high today. Reported reading of 177/87, 178/89, 176/98 as the most recent readings. She take losartan '100mg'$  daily in the morning and Dilt '120mg'$  in the evening. Has not had her evening dose as she usually takes at [place] to go ahead and take Dilt '120mg'$  now, if BP remains elevated >978 systolic in several hours then ok to take a second Dilt as HR in the 80-90 range. Instructed to follow up blood pressures tomorrow and callback to office if remain elevated for further recommendations. She was agreeable and thanked me for callback. Will route to NL triage as FYI ?

## 2022-03-08 NOTE — Telephone Encounter (Signed)
Left a message for the patient to call back to get an update about her blood pressures.  ? ?This morning before she had taken her Losartan, her blood pressure was 164/78 and heart rate was 77. ?

## 2022-03-08 NOTE — Telephone Encounter (Signed)
Left a message for the patient to call back to give an update on how her blood pressure is doing.  ?

## 2022-03-08 NOTE — Telephone Encounter (Signed)
Patient returning call.

## 2022-03-09 ENCOUNTER — Telehealth: Payer: Self-pay | Admitting: Physician Assistant

## 2022-03-09 NOTE — Telephone Encounter (Signed)
Patient paged after hour answering service due to elevated blood pressure in the 170s.  She is currently on maximum dose of losartan in the morning and 120 mg of diltiazem at night.  I instructed the patient to take a second dose of diltiazem on a as needed basis if systolic blood pressure is greater than 160 mmHg.  She has upcoming follow-up in the cardiology office in June with Dr. Sallyanne Kuster where her blood pressure medication can be further titrated. ?

## 2022-03-11 NOTE — Telephone Encounter (Signed)
It is just about time for her follow-up echocardiogram for aortic stenosis.  Can we schedule that please? ?

## 2022-03-11 NOTE — Telephone Encounter (Signed)
Spoke with pt, she reports her blood pressure is doing better. She is still taking diltiazem 120 mg at 5:30 pm and 10:30 to 11 pm. She also wanted to let Dr Sallyanne Kuster know she is tired as he wanted to know that related to her valve. Aware will make dr c aware. ?

## 2022-03-11 NOTE — Telephone Encounter (Signed)
Patient calling back. She states her BP was not as bad at 159/50 not as bad, but she feels very tired. She says she was told to contact the office if she noticed herself becoming more tired. ?

## 2022-03-12 NOTE — Telephone Encounter (Signed)
Spoke with pt, aware of dr croitou's recommendations. Echo scheduled for 03/26/22 at 2 pm. Aware follow uop appointment will be moved up if needed after echo. ?

## 2022-03-19 ENCOUNTER — Telehealth: Payer: Self-pay | Admitting: Cardiovascular Disease

## 2022-03-19 NOTE — Telephone Encounter (Signed)
Call not placed by this RN. Patient aware ?

## 2022-03-19 NOTE — Telephone Encounter (Signed)
Spoke to patient she stated she is doing good.She missed a call from [place].Advised I will send message to her unable to know why she called. ?

## 2022-03-19 NOTE — Telephone Encounter (Signed)
? ?  Pt said, she missed a call from Dr. Lurline Del nurse last Friday but there's no notes. She wanted to speak with a nurse and ask to call her back at [phone] ?

## 2022-03-26 ENCOUNTER — Ambulatory Visit (HOSPITAL_COMMUNITY): Payer: Medicare HMO | Attending: Cardiology

## 2022-03-26 DIAGNOSIS — I350 Nonrheumatic aortic (valve) stenosis: Secondary | ICD-10-CM

## 2022-03-26 LAB — ECHOCARDIOGRAM COMPLETE
AR max vel: 1.06 cm2
AV Area VTI: 1.14 cm2
AV Area mean vel: 1.05 cm2
AV Mean grad: 17 mmHg
AV Peak grad: 34.6 mmHg
Ao pk vel: 2.94 m/s
Area-P 1/2: 3.93 cm2
S' Lateral: 2.3 cm

## 2022-04-15 ENCOUNTER — Other Ambulatory Visit (HOSPITAL_COMMUNITY): Payer: Medicare HMO

## 2022-05-29 ENCOUNTER — Encounter: Payer: Self-pay | Admitting: Cardiovascular Disease

## 2022-05-29 ENCOUNTER — Ambulatory Visit (INDEPENDENT_AMBULATORY_CARE_PROVIDER_SITE_OTHER): Payer: Medicare HMO | Admitting: Cardiovascular Disease

## 2022-05-29 VITALS — BP 122/70 | HR 65 | Ht <= 58 in | Wt 164.8 lb

## 2022-05-29 DIAGNOSIS — I10 Essential (primary) hypertension: Secondary | ICD-10-CM

## 2022-05-29 DIAGNOSIS — E7800 Pure hypercholesterolemia, unspecified: Secondary | ICD-10-CM

## 2022-05-29 DIAGNOSIS — I350 Nonrheumatic aortic (valve) stenosis: Secondary | ICD-10-CM | POA: Diagnosis not present

## 2022-05-29 NOTE — Progress Notes (Signed)
It is Cardiology Office Note:    Date:  06/01/2022   ID:  Shelley [name], DOB Dec 27, 1931, MRN [id number]  PCP:  Lajean Manes, MD  Cardiologist:  Sanda Klein, MD   Referring MD: Lajean Manes, MD   Chief Complaint  Patient presents with   Cardiac Valve Problem  Hyperlipidemia, HTN  History of Present Illness:    Shelley [name] is a 88 y.o. female with a hx of moderate aortic stenosis hypothyroidism, hypertension and hyperlipidemia.  She always accompanied her late husband, Shelley [name] to his office appointments; he passed away about 5 years ago.    She is doing well, without any cardiovascular complaints.  Living independently still has some issues with anxiety but has learned to cope.  Denies exertional angina/dyspnea/syncope/presyncope.  Has not had orthopnea, PND, lower extremity edema, palpitations, focal neurological events.  Remains socially engaged.  Looking forward to a visit from her daughters next month.  Follow-up echocardiography continues to shows moderate aortic stenosis.  A few months ago she moved out of the house that she shared with her husband then she has been very anxious ever since.  She loves her new apartment and remains socially engaged, but nevertheless feels uneasy.  Just before moving, she had a fall with a pattern suggesting orthostatic hypotension, may be related to relative hypovolemia.  She had a bad leg cramp and try to get out of bed to walk it off after which she fell.  It sounds like she briefly lost consciousness.  CT head was okay.  She was found to have abnormalities in her electrolytes and hydrochlorothiazide was discontinued.  Since then she has had occasional problems with mild ankle edema and her blood pressure is consistently in the 140s or 150s.  Follow-up labs on 04/24/2020 showed normal creatinine of 0.83 and normal potassium 4.2.       Past Medical History:  Diagnosis Date   Hyperlipidemia    Hypertension    Thyroid disease      History reviewed. No pertinent surgical history.  Current Medications: Current Meds  Medication Sig   busPIRone (BUSPAR) 10 MG tablet Take 1 tablet by mouth 3 (three) times daily.    calcium carbonate (OS-CAL) 600 MG TABS tablet Take 600 mg by mouth daily with breakfast.   Cholecalciferol 25 MCG (1000 UT) capsule Take 1,000 Units by mouth daily.   Coenzyme Q10 (CO Q 10) 60 MG CAPS See admin instructions.   diltiazem (CARDIZEM CD) 120 MG 24 hr capsule Take 1 capsule (120 mg total) by mouth daily.   diltiazem (CARDIZEM SR) 120 MG 12 hr capsule Take 120 mg by mouth daily in the afternoon.   levothyroxine (SYNTHROID, LEVOTHROID) 75 MCG tablet Take 88 mcg by mouth daily before breakfast.   losartan (COZAAR) 100 MG tablet Take 100 mg by mouth daily.   melatonin 1 MG TABS tablet Take 1 mg by mouth at bedtime.   Omega 3 1000 MG CAPS Take 1 capsule by mouth daily.   omeprazole (PRILOSEC) 40 MG capsule Take 40 mg by mouth as directed.   rosuvastatin (CRESTOR) 20 MG tablet Take 1 tablet (20 mg total) by mouth daily.   traZODone (DESYREL) 50 MG tablet Take 25 mg by mouth at bedtime.      Allergies:   Patient has no known allergies.   Social History   Socioeconomic History   Marital status: Married    Spouse name: Not on file   Number of children: Not on file  Years of education: Not on file   Highest education level: Not on file  Occupational History   Not on file  Tobacco Use   Smoking status: Never   Smokeless tobacco: Never  Substance and Sexual Activity   Alcohol use: Not on file   Drug use: Not on file   Sexual activity: Not on file  Other Topics Concern   Not on file  Social History Narrative   Not on file   Social Determinants of Health   Financial Resource Strain: Not on file  Food Insecurity: Not on file  Transportation Needs: Not on file  Physical Activity: Not on file  Stress: Not on file  Social Connections: Not on file     Family History: Negative for  early onset coronary or vascular disease ROS:   Please see the history of present illness.    All other systems are reviewed and are negative.   EKGs/Labs/Other Studies Reviewed:    The following studies were reviewed today: Echocardiogram 03/26/2022   1. Left ventricular ejection fraction, by estimation, is 60 to 65%. The  left ventricle has normal function. The left ventricle has no regional  wall motion abnormalities. There is mild left ventricular hypertrophy.  Left ventricular diastolic parameters  are consistent with Grade I diastolic dysfunction (impaired relaxation).   2. Right ventricular systolic function is normal. The right ventricular  size is normal. There is normal pulmonary artery systolic pressure. The  estimated right ventricular systolic pressure is 40.9 mmHg.   3. The mitral valve is normal in structure. No evidence of mitral valve  regurgitation. No evidence of mitral stenosis.   4. The inferior vena cava is normal in size with greater than 50%  respiratory variability, suggesting right atrial pressure of 3 mmHg.   5. The aortic valve is tricuspid. There is moderate calcification of the  aortic valve. Aortic valve regurgitation is not visualized. Moderate  aortic valve stenosis. Vmax 2.9 m/s, MG 21mHg, AVA 1.1 cm^2, DI 0.39   EKG:  EKG is ordered today.  It shows sinus rhythm and is a completely normal tracing Recent Labs: 11/09/2021 Hemoglobin 13.1, creatinine 0.6, potassium 4.6, ALT 14, TSH 0.54  05/24/2022 Creatinine 1.02, potassium 3.9, ALT 13, TSH 0.96, hemoglobin 12.7 Recent Lipid Panel 11/09/2021 Cholesterol 176, HDL 54, LDL 94, triglycerides 164   Physical Exam:    VS:  BP 122/70 (BP Location: Left Arm, Patient Position: Sitting, Cuff Size: Normal)   Pulse 65   Ht '4\' 9"'$  (1.448 m)   Wt 164 lb 12.8 oz (74.8 kg)   LMP  (LMP Unknown)   SpO2 94%   BMI 35.66 kg/m     Wt Readings from Last 3 Encounters:  05/29/22 164 lb 12.8 oz (74.8 kg)   11/23/21 162 lb 6.4 oz (73.7 kg)  04/04/21 165 lb (74.8 kg)      General: Alert, oriented x3, no distress, appears younger than stated age Head: no evidence of trauma, PERRL, EOMI, no exophtalmos or lid lag, no myxedema, no xanthelasma; normal ears, nose and oropharynx Neck: normal jugular venous pulsations and no hepatojugular reflux; brisk carotid pulses without delay and no carotid bruits Chest: clear to auscultation, no signs of consolidation by percussion or palpation, normal fremitus, symmetrical and full respiratory excursions Cardiovascular: normal position and quality of the apical impulse, regular rhythm, normal first and second heart sounds, 2-3/6 aortic ejection murmur is early peaking, no diastolic murmurs, rubs or gallops Abdomen: no tenderness or distention, no masses by  palpation, no abnormal pulsatility or arterial bruits, normal bowel sounds, no hepatosplenomegaly Extremities: no clubbing, cyanosis or edema; 2+ radial, ulnar and brachial pulses bilaterally; 2+ right femoral, posterior tibial and dorsalis pedis pulses; 2+ left femoral, posterior tibial and dorsalis pedis pulses; no subclavian or femoral bruits Neurological: grossly nonfocal Psych: Normal mood and affect    ASSESSMENT:    1. Essential hypertension   2. Nonrheumatic aortic (valve) stenosis   3. Hypercholesterolemia     PLAN:    In order of problems listed above:  HTN: Well-controlled.  Had to stop her hydrochlorothiazide due to lab abnormalities.  Has also had issues with symptomatic orthostatic hypotension in the past.   AS: Remains moderate by follow-up echo performed couple of months ago.  Recheck yearly, sooner if she develops aortic stenosis type symptoms (exertional dyspnea, exertional angina, exertional syncope).  He has been advised to call us if the symptoms appear. HLP: Lipid parameters are acceptable.  Continue statin.    Medication Adjustments/Labs and Tests Ordered: Current medicines  are reviewed at length with the patient today.  Concerns regarding medicines are outlined above.  Orders Placed This Encounter  Procedures   EKG 12-Lead   ECHOCARDIOGRAM COMPLETE   No orders of the defined types were placed in this encounter.   Patient Instructions  Medication Instructions:  No changes *If you need a refill on your cardiac medications before your next appointment, please call your pharmacy*   Lab Work: None ordered If you have labs (blood work) drawn today and your tests are completely normal, you will receive your results only by: [place] (if you have MyChart) OR A paper copy in the mail If you have any lab test that is abnormal or we need to change your treatment, we will call you to review the results.   Testing/Procedures: Your physician has requested that you have an echocardiogram in 12 months. Echocardiography is a painless test that uses sound waves to create images of your heart. It provides your doctor with information about the size and shape of your heart and how well your heart's chambers and valves are working. You may receive an ultrasound enhancing agent through an IV if needed to better visualize your heart during the echo.This procedure takes approximately one hour. There are no restrictions for this procedure. This will take place at the [address].     Follow-Up: At [facility], you and your health needs are our priority.  As part of our continuing mission to provide you with exceptional heart care, we have created designated Provider Care Teams.  These Care Teams include your primary Cardiologist (physician) and Advanced Practice Providers (APPs -  Physician Assistants and Nurse Practitioners) who all work together to provide you with the care you need, when you need it.  We recommend signing up for the patient portal called "MyChart".  Sign up information is provided on this After Visit Summary.  MyChart is used to  connect with patients for Virtual Visits (Telemedicine).  Patients are able to view lab/test results, encounter notes, upcoming appointments, etc.  Non-urgent messages can be sent to your provider as well.   To learn more about what you can do with MyChart, go to [email or web address].    Your next appointment:   12 month(s)  The format for your next appointment:   In Person  Provider:   Sanda Klein, MD {    Important Information About Sugar  Signed, Sanda Klein, MD  06/01/2022 6:49 PM    [place]

## 2022-05-29 NOTE — Patient Instructions (Signed)
Medication Instructions:  No changes *If you need a refill on your cardiac medications before your next appointment, please call your pharmacy*   Lab Work: None ordered If you have labs (blood work) drawn today and your tests are completely normal, you will receive your results only by: MyChart Message (if you have MyChart) OR A paper copy in the mail If you have any lab test that is abnormal or we need to change your treatment, we will call you to review the results.   Testing/Procedures: Your physician has requested that you have an echocardiogram in 12 months. Echocardiography is a painless test that uses sound waves to create images of your heart. It provides your doctor with information about the size and shape of your heart and how well your heart's chambers and valves are working. You may receive an ultrasound enhancing agent through an IV if needed to better visualize your heart during the echo.This procedure takes approximately one hour. There are no restrictions for this procedure. This will take place at the [address].     Follow-Up: At [facility], you and your health needs are our priority.  As part of our continuing mission to provide you with exceptional heart care, we have created designated Provider Care Teams.  These Care Teams include your primary Cardiologist (physician) and Advanced Practice Providers (APPs -  Physician Assistants and Nurse Practitioners) who all work together to provide you with the care you need, when you need it.  We recommend signing up for the patient portal called "MyChart".  Sign up information is provided on this After Visit Summary.  MyChart is used to connect with patients for Virtual Visits (Telemedicine).  Patients are able to view lab/test results, encounter notes, upcoming appointments, etc.  Non-urgent messages can be sent to your provider as well.   To learn more about what you can do with MyChart, go to  [email or web address].    Your next appointment:   12 month(s)  The format for your next appointment:   In Person  Provider:   Mihai Croitoru, MD {  Important Information About Sugar       

## 2022-06-01 ENCOUNTER — Encounter: Payer: Self-pay | Admitting: Cardiovascular Disease

## 2022-07-02 ENCOUNTER — Other Ambulatory Visit: Payer: Self-pay | Admitting: Cardiovascular Disease

## 2022-07-06 ENCOUNTER — Other Ambulatory Visit: Payer: Self-pay | Admitting: Cardiovascular Disease

## 2022-09-09 ENCOUNTER — Other Ambulatory Visit: Payer: Self-pay | Admitting: Internal Medicine

## 2022-09-09 DIAGNOSIS — J320 Chronic maxillary sinusitis: Secondary | ICD-10-CM

## 2022-11-12 ENCOUNTER — Telehealth: Payer: Self-pay | Admitting: Cardiovascular Disease

## 2022-11-12 ENCOUNTER — Encounter: Payer: Self-pay | Admitting: Cardiovascular Disease

## 2022-11-12 ENCOUNTER — Other Ambulatory Visit: Payer: Self-pay

## 2022-11-12 DIAGNOSIS — I350 Nonrheumatic aortic (valve) stenosis: Secondary | ICD-10-CM

## 2022-11-12 NOTE — Telephone Encounter (Signed)
Called patient, see telephone note

## 2022-11-12 NOTE — Telephone Encounter (Signed)
Please go ahead and schedule the echo for aortic stenosis, that we had planned for April, to be done first available instead (not STAT). Thanks

## 2022-11-12 NOTE — Telephone Encounter (Signed)
I called patient after receiving a mychart message in regards to some issues.   Patient states she is having chest pressure, vision becomes blurry at times, and fatigue. Shelley [name] states her blood pressure has been doing okay at home. It was 129/73 HR 82, she did not check her BP this morning, but did check her HR it was 99.   She is taking Losartan 100 and Diltazem still at this time. No other changes to medications.   Patient is watching her salt and diet. She would just like for Dr.C to be made aware since he advised her to call with any issues.   I will route to MD.

## 2022-11-12 NOTE — Telephone Encounter (Signed)
  Pt is calling to follow up her mychart message sent today. Pt said, to call her at [phone]

## 2022-11-28 ENCOUNTER — Ambulatory Visit (HOSPITAL_COMMUNITY): Payer: Medicare HMO | Attending: Cardiology

## 2022-11-28 DIAGNOSIS — I350 Nonrheumatic aortic (valve) stenosis: Secondary | ICD-10-CM

## 2022-11-28 LAB — ECHOCARDIOGRAM COMPLETE
AR max vel: 1.42 cm2
AV Area VTI: 1.4 cm2
AV Area mean vel: 1.16 cm2
AV Mean grad: 16 mmHg
AV Peak grad: 28.3 mmHg
Ao pk vel: 2.66 m/s
Area-P 1/2: 2.63 cm2
S' Lateral: 2.3 cm

## 2023-01-07 ENCOUNTER — Telehealth: Payer: Self-pay | Admitting: Cardiovascular Disease

## 2023-01-07 DIAGNOSIS — I350 Nonrheumatic aortic (valve) stenosis: Secondary | ICD-10-CM

## 2023-01-07 NOTE — Telephone Encounter (Signed)
Called pt she states at 5am this morning she states she woke up and could not breath. She denies congestion. "I made effort to make air but I could not. It didn't last long but I wanted to know is there something I have to do to fix it." Pt denies any other symptoms.

## 2023-01-07 NOTE — Telephone Encounter (Addendum)
Pt called back, she states her blood pressure this morning was 131/55 HR 69. The shortness of breath has improved. She states she can breath but she is anxious about not being able to breath. Echo order placed per Dr. Loletha Grayer. Was looking for an appt with an APP, phone cut off. Attempted to call pt back 2 times no answer.

## 2023-01-07 NOTE — Telephone Encounter (Signed)
Patient would like to speak to the nurse bout her breathing. Please advise

## 2023-01-07 NOTE — Telephone Encounter (Signed)
Pt returned the call. Appt made for next week with Raquel Sarna.

## 2023-01-07 NOTE — Telephone Encounter (Signed)
Is she able to report BP and HR? Is she still short of breath or is it improved?  Please schedule for echo for aortic stenosis now (rather than waiting until April). Schedule office appointment, APP ok.

## 2023-01-15 ENCOUNTER — Ambulatory Visit: Payer: Medicare HMO | Attending: Nurse Practitioner | Admitting: Nurse Practitioner

## 2023-01-15 ENCOUNTER — Encounter: Payer: Self-pay | Admitting: Nurse Practitioner

## 2023-01-15 ENCOUNTER — Encounter (HOSPITAL_COMMUNITY): Payer: Self-pay | Admitting: Cardiovascular Disease

## 2023-01-15 VITALS — BP 106/60 | HR 82 | Ht <= 58 in | Wt 166.0 lb

## 2023-01-15 DIAGNOSIS — E782 Mixed hyperlipidemia: Secondary | ICD-10-CM

## 2023-01-15 DIAGNOSIS — F419 Anxiety disorder, unspecified: Secondary | ICD-10-CM

## 2023-01-15 DIAGNOSIS — R0602 Shortness of breath: Secondary | ICD-10-CM | POA: Diagnosis not present

## 2023-01-15 DIAGNOSIS — I10 Essential (primary) hypertension: Secondary | ICD-10-CM

## 2023-01-15 DIAGNOSIS — I350 Nonrheumatic aortic (valve) stenosis: Secondary | ICD-10-CM

## 2023-01-15 DIAGNOSIS — E039 Hypothyroidism, unspecified: Secondary | ICD-10-CM

## 2023-01-15 NOTE — Patient Instructions (Signed)
Medication Instructions:  Decrease Losartan 50 mg daily  *If you need a refill on your cardiac medications before your next appointment, please call your pharmacy*   Lab Work: NONE ordered at this time of appointment   If you have labs (blood work) drawn today and your tests are completely normal, you will receive your results only by: [place] (if you have MyChart) OR A paper copy in the mail If you have any lab test that is abnormal or we need to change your treatment, we will call you to review the results.   Testing/Procedures: NONE ordered at this time of appointment     Follow-Up: At [facility], you and your health needs are our priority.  As part of our continuing mission to provide you with exceptional heart care, we have created designated Provider Care Teams.  These Care Teams include your primary Cardiologist (physician) and Advanced Practice Providers (APPs -  Physician Assistants and Nurse Practitioners) who all work together to provide you with the care you need, when you need it.  We recommend signing up for the patient portal called "MyChart".  Sign up information is provided on this After Visit Summary.  MyChart is used to connect with patients for Virtual Visits (Telemedicine).  Patients are able to view lab/test results, encounter notes, upcoming appointments, etc.  Non-urgent messages can be sent to your provider as well.   To learn more about what you can do with MyChart, go to [email or web address].    Your next appointment:    Keep Upcoming appointment   Provider:   Sanda Klein, MD     Other Instructions Monitor Blood Pressure. Contact our office if BP is consistently greater than 140/90. Take BP 2 hours after taking medication and make sure you've been seated for 5-10 mins after getting your BP cuff.

## 2023-01-15 NOTE — Progress Notes (Addendum)
Office Visit    Patient Name: Shelley [name] Date of Encounter: 01/15/2023  Primary Care Provider:  Lajean Manes, MD Primary Cardiologist:  Sanda Klein, MD  Chief Complaint    89 year old female with a history of moderate aortic stenosis, hypertension, hyperlipidemia, anxiety and hypothyroidism who presents for follow-up related to aortic stenosis and shortness of breath.  Past Medical History    Past Medical History:  Diagnosis Date   Hyperlipidemia    Hypertension    Thyroid disease    History reviewed. No pertinent surgical history.  Allergies  No Known Allergies   Labs/Other Studies Reviewed    The following studies were reviewed today: Echo 11/2022: IMPRESSIONS    1. Left ventricular ejection fraction, by estimation, is 65 to 70%. The  left ventricle has normal function. The left ventricle has no regional  wall motion abnormalities. There is moderate concentric left ventricular  hypertrophy. Left ventricular  diastolic parameters are consistent with Grade I diastolic dysfunction  (impaired relaxation).   2. Right ventricular systolic function is normal. The right ventricular  size is normal. There is normal pulmonary artery systolic pressure. The  estimated right ventricular systolic pressure is [id number].   3. Left atrial size was mildly dilated.   4. The mitral valve is grossly normal. Trivial mitral valve  regurgitation. No evidence of mitral stenosis.   5. Tricuspid valve regurgitation is mild to moderate.   6. The aortic valve is tricuspid. There is moderate calcification of the  aortic valve. There is moderate thickening of the aortic valve. Aortic  valve regurgitation is not visualized. Mild to moderate aortic valve  stenosis. Aortic valve area, by VTI  measures 1.40 cm. Aortic valve mean gradient measures 16.0 mmHg. Aortic  valve Vmax measures 2.66 m/s.   7. The inferior vena cava is normal in size with greater than 50%  respiratory  variability, suggesting right atrial pressure of 3 mmHg.   Comparison(s): Prior TTE on 03/2022 with AoV mean gradient 64mHg, Vmax  2.967m.   Recent Labs: No results found for requested labs within last 365 days.  Recent Lipid Panel No results found for: "CHOL", "TRIG", "HDL", "CHOLHDL", "VLDL", "LDLCALC", "LDLDIRECT"  History of Present Illness    [age]ear old female with the above past medical history including moderate aortic stenosis, hypertension, hyperlipidemia, anxiety and hypothyroidism.  Since the death of her husband approximately 5 years ago she has had difficulty with anxiety, particularly after she moved out of her house and into an apartment.  She had a fall in 2021.  She had a bad leg cramp and tried to get out of bed to walk it off after which she fell.  CT of the head was unremarkable.  She was found to have electrolyte abnormalities and hydrochlorothiazide was subsequently discontinued. Sine then she has had occasional problems with mild ankle edema, SBP has consistently been in the 140s to 150s.  She was last seen in the office on 05/29/2022 and was stable from a cardiac standpoint.  Most recent echocardiogram in 11/2022 showed EF 65 to 70%, normal LV function, no RWMA, G1 DD, mild to moderate tricuspid valve regurgitation, mild to moderate aortic valve stenosis, mean gradient 16 mmHg, overall stable compared to prior echo.  She contacted our office on 01/07/2023 with reports of increased shortness of breath.  Repeat echocardiogram was recommended and is pending.  She presents today for follow-up.  Since her last visit she has done well from a cardiac standpoint.  She had an  episode where she woke up at 5 AM and felt that she could not "get air."  She felt that she could not take a deep breath through her nose.  She saw her PCP who felt that this was possibly related to "inflammation in her nasal passage."  She was given a short course of prednisone with mild improvement.  She denies  exertional symptoms of dyspnea, denies chest pain, PND, orthopnea, weight gain.  She has noted some lightheadedness associated with borderline low BP.  Otherwise, she reports feeling well.  Home Medications    Current Outpatient Medications  Medication Sig Dispense Refill   amLODipine (NORVASC) 5 MG tablet Take 5 mg by mouth daily.     Biotin 5 MG CAPS      busPIRone (BUSPAR) 10 MG tablet Take 1 tablet by mouth 3 (three) times daily.   6   busPIRone (BUSPAR) 10 MG tablet Take 10 mg by mouth in the morning, at noon, and at bedtime.     calcium carbonate (OS-CAL) 600 MG TABS tablet Take 600 mg by mouth daily with breakfast.     Cholecalciferol 25 MCG (1000 UT) capsule Take 1,000 Units by mouth daily.     Coenzyme Q10 (CO Q 10) 60 MG CAPS See admin instructions.     levothyroxine (SYNTHROID, LEVOTHROID) 75 MCG tablet Take 88 mcg by mouth daily before breakfast.     losartan (COZAAR) 100 MG tablet Take 50 mg by mouth daily.     melatonin 1 MG TABS tablet Take 1 mg by mouth at bedtime.     Omega 3 1000 MG CAPS Take 1 capsule by mouth daily.     omeprazole (PRILOSEC) 40 MG capsule Take 40 mg by mouth as directed.     rosuvastatin (CRESTOR) 20 MG tablet TAKE 1 TABLET DAILY (REPLACES SIMVASTATIN) 90 tablet 3   traZODone (DESYREL) 50 MG tablet Take 25 mg by mouth at bedtime.      No current facility-administered medications for this visit.     Review of Systems    She denies chest pain, palpitations, dyspnea, pnd, orthopnea, n, v, dizziness, syncope, edema, weight gain, or early satiety. All other systems reviewed and are otherwise negative except as noted above.   Physical Exam    VS:  BP 106/60 (BP Location: Left Arm, Patient Position: Sitting, Cuff Size: Normal)   Pulse 82   Ht 4' 9"$  (1.448 m)   Wt 166 lb (75.3 kg)   LMP  (LMP Unknown)   BMI 35.92 kg/m   GEN: Well nourished, well developed, in no acute distress. HEENT: normal. Neck: Supple, no JVD, carotid bruits, or  masses. Cardiac: RRR, 3/6 murmur, no rubs, or gallops. No clubbing, cyanosis, edema.  Radials/DP/PT 2+ and equal bilaterally.  Respiratory:  Respirations regular and unlabored, clear to auscultation bilaterally. GI: Soft, nontender, nondistended, BS + x 4. MS: no deformity or atrophy. Skin: warm and dry, no rash. Neuro:  Strength and sensation are intact. Psych: Normal affect.  Accessory Clinical Findings    ECG personally reviewed by me today -NSR, 82 bpm- no acute changes.   No results found for: "WBC", "HGB", "HCT", "MCV", "PLT" Lab Results  Component Value Date   CREATININE 1.08 (H) 03/07/2021   BUN 14 03/07/2021   NA 133 (L) 03/07/2021   K 4.1 03/07/2021   CL 94 (L) 03/07/2021   CO2 24 03/07/2021   No results found for: "ALT", "AST", "GGT", "ALKPHOS", "BILITOT" No results found for: "CHOL", "HDL", "LDLCALC", "  LDLDIRECT", "TRIG", "CHOLHDL"  No results found for: "HGBA1C"  Assessment & Plan    1. Shortness of breath: Most recent echo in 11/2022 showed EF 65 to 70%, normal LV function, no RWMA, G1 DD, mild to moderate tricuspid valve regurgitation, mild to moderate aortic valve stenosis, mean gradient 16 mmHg, overall stable compared to prior echo.  She had a recent episode where she felt she could not take a deep breath through her nose.  She saw her PCP who felt her symptoms were related to "inflammation" in her upper respiratory tract.  She was given prednisone with mild improvement.  Recent labs including CBC, BMET, and TSH were stable.  She denies any further dyspnea, denies exertional symptoms. She has stable nonpitting bilateral ankle edema in the setting of amlodipine use.  Overall, euvolemic and well compensated on exam.  Patient declines repeat echocardiogram at this time.  Continue to monitor symptoms.  2. Aortic stenosis: Mild to moderate on most recent echo.  She denies dyspnea, dizziness, presyncope, syncope. Dr. Sallyanne Kuster recently recommended repeat echocardiogram given  recent shortness of breath as above, however she declines repeat echo at this time.  3. Hypertension: BP has been borderline low with SBP in the low 100s-110s.  She has noted occasional lightheadedness associated with lower BP readings.  Will decrease losartan to 50 mg daily.  Continue to monitor BP and report BP consistently greater than 140/90. Otherwise, continue current antihypertensive regimen.   4. Hyperlipidemia: LDL was 90 in 12/2022. Continue Crestor.   5. Anxiety: She denies worsening symptoms of anxiety.  Overall well-controlled.  6. Hypothyroidism: TSH was 0.93 in 12/2022.   7. Disposition: Follow-up as scheduled with Dr. Abbie Sons in 06/2023.      Lenna Sciara, NP 01/15/2023, 12:54 PM

## 2023-01-29 ENCOUNTER — Telehealth (HOSPITAL_COMMUNITY): Payer: Self-pay | Admitting: Cardiovascular Disease

## 2023-01-29 NOTE — Telephone Encounter (Signed)
Just an FYI. We have made several attempts to contact this patient including sending a letter to schedule or reschedule their echocardiogram. We will be removing the patient from the echo WQ.   SENT  LETTER THRU MY CHART  [facility] 01/15/23 [facility] tos chedule x 3 @ 9:41/LBW 01/11/20 [facility] to schedule @ 9:33/LBW 01/08/23'@907'$  [facility] to schedule EVD       Thank you

## 2023-04-24 ENCOUNTER — Telehealth: Payer: Self-pay | Admitting: Cardiovascular Disease

## 2023-04-24 NOTE — Telephone Encounter (Signed)
Patient stated she had an Echo on 11/28/22 and is currently scheduled for an Echo on 6/26.  Patient wants to confirm she has to have the Echo test sooner than 1 year.

## 2023-04-24 NOTE — Telephone Encounter (Signed)
Spoke with patient and confirmed that per her last OV in December, she is to have an echo in one year.  She will call to reschedule toward end of year.

## 2023-04-29 ENCOUNTER — Other Ambulatory Visit: Payer: Self-pay | Admitting: *Deleted

## 2023-04-29 DIAGNOSIS — I350 Nonrheumatic aortic (valve) stenosis: Secondary | ICD-10-CM

## 2023-05-28 ENCOUNTER — Other Ambulatory Visit (HOSPITAL_COMMUNITY): Payer: Medicare HMO

## 2023-06-10 ENCOUNTER — Other Ambulatory Visit: Payer: Self-pay | Admitting: Cardiovascular Disease

## 2023-06-20 ENCOUNTER — Ambulatory Visit: Payer: Medicare HMO | Attending: Cardiovascular Disease | Admitting: Cardiovascular Disease

## 2023-06-20 ENCOUNTER — Encounter: Payer: Self-pay | Admitting: Cardiovascular Disease

## 2023-06-20 VITALS — BP 148/80 | HR 69 | Ht <= 58 in | Wt 164.6 lb

## 2023-06-20 DIAGNOSIS — I350 Nonrheumatic aortic (valve) stenosis: Secondary | ICD-10-CM

## 2023-06-20 DIAGNOSIS — I10 Essential (primary) hypertension: Secondary | ICD-10-CM

## 2023-06-20 DIAGNOSIS — E7800 Pure hypercholesterolemia, unspecified: Secondary | ICD-10-CM

## 2023-06-20 NOTE — Progress Notes (Signed)
It is Cardiology Office Note:    Date:  06/20/2023   ID:  Shelley [name], DOB 12/13/1931, MRN [id number]  PCP:  Merlene Laughter, MD (Inactive)  Cardiologist:  Thurmon Fair, MD   Referring MD: Merlene Laughter, MD   Chief Complaint  Patient presents with   Cardiac Valve Problem  Hyperlipidemia, HTN,   History of Present Illness:    Shelley [name] is a 89 y.o. female with a hx of mild-moderate aortic stenosis, hypothyroidism, hypertension and hyperlipidemia.  She always accompanied her late husband, Leilani Merl to his office appointments; he passed away about 6 years ago.   The patient specifically denies any chest pain at rest exertion, dyspnea at rest or with exertion, orthopnea, paroxysmal nocturnal dyspnea, syncope, palpitations, focal neurological deficits, intermittent claudication, lower extremity edema, unexplained weight gain, cough, hemoptysis or wheezing.    Exercises daily on treadmill 20-30 minutes. Feels tired afterwards, but no dyspnea or angina or dizziness.  Has not monitored her BP recently. Again a little high today, but was 138/76 at 05/13/2023 evaluation. On max dose telmisartan. Amlodipine caused severe edema. Hydrochlorothiazide stopped due to lab abnormalities and leg cramps.  Echo shows little change in the aortic valve gradients. Mean 16-18 mm Hg on last two echos 8 months apart.  A few years ago she had a fall with a pattern suggesting orthostatic hypotension, may be related to relative hypovolemia.  She had a bad leg cramp and try to get out of bed to walk it off after which she fell.  It sounds like she briefly lost consciousness.  CT head was okay.  She was found to have abnormalities in her electrolytes and hydrochlorothiazide was discontinued.  Since then she has had occasional problems with mild ankle edema and her blood pressure is consistently in the 140s or 150s.        Past Medical History:  Diagnosis Date   Hyperlipidemia     Hypertension    Thyroid disease     History reviewed. No pertinent surgical history.  Current Medications: Current Meds  Medication Sig   busPIRone (BUSPAR) 10 MG tablet Take 1 tablet by mouth 3 (three) times daily.    busPIRone (BUSPAR) 10 MG tablet Take 10 mg by mouth in the morning, at noon, and at bedtime.   calcium carbonate (OS-CAL) 600 MG TABS tablet Take 600 mg by mouth daily with breakfast.   Cholecalciferol 25 MCG (1000 UT) capsule Take 1,000 Units by mouth daily.   Coenzyme Q10 (CO Q 10) 60 MG CAPS See admin instructions.   levothyroxine (SYNTHROID, LEVOTHROID) 75 MCG tablet Take 88 mcg by mouth daily before breakfast.   melatonin 1 MG TABS tablet Take 1 mg by mouth at bedtime.   Omega 3 1000 MG CAPS Take 1 capsule by mouth daily.   omeprazole (PRILOSEC) 40 MG capsule Take 40 mg by mouth as directed.   rosuvastatin (CRESTOR) 20 MG tablet TAKE 1 TABLET DAILY (REPLACES SIMVASTATIN)   telmisartan (MICARDIS) 80 MG tablet Take 80 mg by mouth daily.   traZODone (DESYREL) 50 MG tablet Take 50 mg by mouth at bedtime.     Allergies:   Patient has no known allergies.   Social History   Socioeconomic History   Marital status: Married    Spouse name: Not on file   Number of children: Not on file   Years of education: Not on file   Highest education level: Not on file  Occupational History   Not on file  Tobacco Use   Smoking status: Never   Smokeless tobacco: Never  Substance and Sexual Activity   Alcohol use: Not on file   Drug use: Not on file   Sexual activity: Not on file  Other Topics Concern   Not on file  Social History Narrative   Not on file   Social Determinants of Health   Financial Resource Strain: Not on file  Food Insecurity: Not on file  Transportation Needs: Not on file  Physical Activity: Not on file  Stress: Not on file  Social Connections: Not on file     Family History: Negative for early onset coronary or vascular disease ROS:   Please  see the history of present illness.    All other systems are reviewed and are negative.   EKGs/Labs/Other Studies Reviewed:    The following studies were reviewed today: Echocardiogram 11/28/2022    1. Left ventricular ejection fraction, by estimation, is 65 to 70%. The  left ventricle has normal function. The left ventricle has no regional  wall motion abnormalities. There is moderate concentric left ventricular  hypertrophy. Left ventricular  diastolic parameters are consistent with Grade I diastolic dysfunction  (impaired relaxation).   2. Right ventricular systolic function is normal. The right ventricular  size is normal. There is normal pulmonary artery systolic pressure. The  estimated right ventricular systolic pressure is 31.7 mmHg.   3. Left atrial size was mildly dilated.   4. The mitral valve is grossly normal. Trivial mitral valve  regurgitation. No evidence of mitral stenosis.   5. Tricuspid valve regurgitation is mild to moderate.   6. The aortic valve is tricuspid. There is moderate calcification of the  aortic valve. There is moderate thickening of the aortic valve. Aortic  valve regurgitation is not visualized. Mild to moderate aortic valve  stenosis. Aortic valve area, by VTI  measures 1.40 cm. Aortic valve mean gradient measures 16.0 mmHg. Aortic  valve Vmax measures 2.66 m/s.   7. The inferior vena cava is normal in size with greater than 50%  respiratory variability, suggesting right atrial pressure of 3 mmHg.   Comparison(s): Prior TTE on 03/2022 with AoV mean gradient , Vmax  2.81m/s.   EKG:  EKG is not ordered today.  01/15/2023 ECG shows sinus rhythm and is a completely normal tracing Recent Labs: 11/09/2021 Hemoglobin 13.1, creatinine 0.6, potassium 4.6, ALT 14, TSH 0.54  05/24/2022 Creatinine 1.02, potassium 3.9, ALT 13, TSH 0.96, hemoglobin 12.7 Recent Lipid Panel 11/09/2021 Cholesterol 176, HDL 54, LDL 94, triglycerides  164  12/25/2022 Chol 176, HDL 54, LDL 90, TG 189   Physical Exam:    VS:  BP (!) 148/80 (BP Location: Left Arm, Patient Position: Sitting, Cuff Size: Normal)   Pulse 69   Ht 4\' 9"  (1.448 m)   Wt 164 lb 9.6 oz (74.7 kg)   LMP  (LMP Unknown)   SpO2 99%   BMI 35.62 kg/m     Wt Readings from Last 3 Encounters:  06/20/23 164 lb 9.6 oz (74.7 kg)  01/15/23 166 lb (75.3 kg)  05/29/22 164 lb 12.8 oz (74.8 kg)      General: Alert, oriented x3, no distress, appears younger than stated age Head: no evidence of trauma, PERRL, EOMI, no exophtalmos or lid lag, no myxedema, no xanthelasma; normal ears, nose and oropharynx Neck: normal jugular venous pulsations and no hepatojugular reflux; brisk carotid pulses without delay and no carotid bruits Chest: clear to auscultation, no signs of consolidation by  percussion or palpation, normal fremitus, symmetrical and full respiratory excursions Cardiovascular: normal position and quality of the apical impulse, regular rhythm, normal first and second heart sounds, 2-3/6 aortic ejection murmur is early peaking, no diastolic murmurs, rubs or gallops Abdomen: no tenderness or distention, no masses by palpation, no abnormal pulsatility or arterial bruits, normal bowel sounds, no hepatosplenomegaly Extremities: no clubbing, cyanosis or edema; 2+ radial, ulnar and brachial pulses bilaterally; 2+ right femoral, posterior tibial and dorsalis pedis pulses; 2+ left femoral, posterior tibial and dorsalis pedis pulses; no subclavian or femoral bruits Neurological: grossly nonfocal Psych: Normal mood and affect    ASSESSMENT:    1. Essential hypertension   2. Nonrheumatic aortic (valve) stenosis   3. Hypercholesterolemia      PLAN:    In order of problems listed above:  HTN: not well controlled.  Had to stop her hydrochlorothiazide due to lab abnormalities and amlodipine due to edema.  Has also had issues with symptomatic orthostatic hypotension in the  past.  Keep a log at home for a week and then we can [facility] decision re: change in treatment. May be enough to switch to a more potent ARB like olmesartan or Edarbi. AS: Remains moderate and no evidence of rapid progression.  Recheck yearly, sooner if she develops aortic stenosis type symptoms (exertional dyspnea, exertional angina, exertional syncope).  Keeping exercising on the treadmill. HLP: No known CAD or PAD. Target LDL<100. Lipid parameters are acceptable.  Continue statin.    Medication Adjustments/Labs and Tests Ordered: Current medicines are reviewed at length with the patient today.  Concerns regarding medicines are outlined above.  No orders of the defined types were placed in this encounter.  No orders of the defined types were placed in this encounter.   Patient Instructions  Medication Instructions:  No changes *If you need a refill on your cardiac medications before your next appointment, please call your pharmacy*  Follow-Up: At [facility], you and your health needs are our priority.  As part of our continuing mission to provide you with exceptional heart care, we have created designated Provider Care Teams.  These Care Teams include your primary Cardiologist (physician) and Advanced Practice Providers (APPs -  Physician Assistants and Nurse Practitioners) who all work together to provide you with the care you need, when you need it.  We recommend signing up for the patient portal called "MyChart".  Sign up information is provided on this After Visit Summary.  MyChart is used to connect with patients for Virtual Visits (Telemedicine).  Patients are able to view lab/test results, encounter notes, upcoming appointments, etc.  Non-urgent messages can be sent to your provider as well.   To learn more about what you can do with MyChart, go to [email or web address].    Your next appointment:   1 year  Provider:   Thurmon Fair, MD       Signed, Thurmon Fair,  MD  06/20/2023 12:18 PM    [facility]

## 2023-06-20 NOTE — Patient Instructions (Addendum)
Medication Instructions:  No changes *If you need a refill on your cardiac medications before your next appointment, please call your pharmacy*  Follow-Up: At [facility], you and your health needs are our priority.  As part of our continuing mission to provide you with exceptional heart care, we have created designated Provider Care Teams.  These Care Teams include your primary Cardiologist (physician) and Advanced Practice Providers (APPs -  Physician Assistants and Nurse Practitioners) who all work together to provide you with the care you need, when you need it.  We recommend signing up for the patient portal called "MyChart".  Sign up information is provided on this After Visit Summary.  MyChart is used to connect with patients for Virtual Visits (Telemedicine).  Patients are able to view lab/test results, encounter notes, upcoming appointments, etc.  Non-urgent messages can be sent to your provider as well.   To learn more about what you can do with MyChart, go to [email or web address].    Your next appointment:   1 year  Provider:   Thurmon Fair, MD

## 2023-08-12 ENCOUNTER — Ambulatory Visit: Payer: Medicare HMO

## 2023-08-12 ENCOUNTER — Ambulatory Visit: Payer: Medicare HMO | Admitting: Family Medicine

## 2023-08-12 ENCOUNTER — Encounter: Payer: Self-pay | Admitting: Family Medicine

## 2023-08-12 ENCOUNTER — Ambulatory Visit (INDEPENDENT_AMBULATORY_CARE_PROVIDER_SITE_OTHER): Payer: Medicare HMO

## 2023-08-12 ENCOUNTER — Ambulatory Visit: Payer: Self-pay

## 2023-08-12 ENCOUNTER — Telehealth: Payer: Self-pay

## 2023-08-12 VITALS — BP 142/86 | HR 78 | Ht 59.0 in | Wt 165.0 lb

## 2023-08-12 DIAGNOSIS — G5601 Carpal tunnel syndrome, right upper limb: Secondary | ICD-10-CM

## 2023-08-12 DIAGNOSIS — M170 Bilateral primary osteoarthritis of knee: Secondary | ICD-10-CM

## 2023-08-12 DIAGNOSIS — M25561 Pain in right knee: Secondary | ICD-10-CM

## 2023-08-12 DIAGNOSIS — G8929 Other chronic pain: Secondary | ICD-10-CM

## 2023-08-12 DIAGNOSIS — M25562 Pain in left knee: Secondary | ICD-10-CM

## 2023-08-12 NOTE — Telephone Encounter (Signed)
-----   Message from [name] sent at 08/12/2023  2:36 PM EDT ----- Regarding: Gel and Zilretta Please auth gel injection or single dose gel injection left knee and Zilretta both knees.

## 2023-08-12 NOTE — Progress Notes (Unsigned)
I, [name], CMA acting as a scribe for [name], MD.  Shelley [name] is a 89 y.o. female who presents to [facility] at [facility] today for L knee pain intermittently over the past several years. Pt locates pain to joint line. Had single dose gel shot in the right knee about 1 week ago, minimal relief with this. Sx have been severe at times causing her to use walker during ambulation. Notes weakness in the right knee.   L Knee swelling: yes R>L Mechanical symptoms:  Aggravates: WB Treatments tried: gel shots, ice, Epsom salt soaking, Blue Emu  She also has right pressure many paresthesias.  She has been told this may be carpal tunnel syndrome and has tried using a carpal tunnel brace for the last month with mild benefit.  Pertinent review of systems: No fevers or chills  Relevant historical information: Hypertension   Exam:  BP (!) 142/86   Pulse 78   Ht 4\' 11"  (1.499 m)   Wt 165 lb (74.8 kg)   LMP  (LMP Unknown)   SpO2 97%   BMI 33.33 kg/m  General: Well Developed, well nourished, and in no acute distress.   MSK: Knees bilaterally degenerative appearing with mild effusion.  Decreased range of motion.  Antalgic gait.  Right upper extremity.  Normal hand motion and strength.  Positive Tinel's carpal tunnel.    Lab and Radiology Results  X-ray images bilateral knees obtained today personally and independently interpreted.  Right knee: Severe medial compartment DJD with some subluxation medially.  No acute fractures are visible.  Left knee: Moderate to severe medial compartment DJD.  No acute fractures are visible.  Await formal radiology review   Assessment and Plan: 89 y.o. female with bilateral knee pain due to DJD.  She had a right knee Durolane or single-dose gel injection relatively recently with little benefit.  She has not had a similar procedure in the left knee.  Orthopedic surgeon that she was seen in is not in her network  so she would like to transfer her knee pain care to this office.  Will work on authorization for [employer] injections for both knees and for a single dose gel injection for the left knee.  If gel injection or Zilretta injections are not sufficient to control pain next step would be either a knee replacement or geniculate artery embolization for chronic knee pain control.  She states that she does not think she is a good knee replacement candidate and likely will proceed to the geniculate artery embolization if the injections as above do not work.  Carpal tunnel syndrome right: We talked about options.  Consider steroid injection.  She like to wait until she comes back for her knee injections to proceed with carpal tunnel injection which I think is reasonable.  Continue carpal tunnel wrist brace for now.   PDMP not reviewed this encounter. Orders Placed This Encounter  Procedures   DG Knee AP/LAT W/Sunrise Left    Standing Status:   Future    Number of Occurrences:   1    Standing Expiration Date:   09/11/2023    Order Specific Question:   Reason for Exam (SYMPTOM  OR DIAGNOSIS REQUIRED)    Answer:   left knee pain    Order Specific Question:   Preferred imaging location?    Answer:   [facility]   DG Knee AP/LAT W/Sunrise Right    Standing Status:   Future  Number of Occurrences:   1    Standing Expiration Date:   09/11/2023    Order Specific Question:   Reason for Exam (SYMPTOM  OR DIAGNOSIS REQUIRED)    Answer:   bilateral knee pain    Order Specific Question:   Preferred imaging location?    Answer:   [facility]   No orders of the defined types were placed in this encounter.    Discussed warning signs or symptoms. Please see discharge instructions. Patient expresses understanding.   The above documentation has been reviewed and is accurate and complete [name], M.D.

## 2023-08-12 NOTE — Patient Instructions (Signed)
Thank you for coming in today.   We can try the gel injection in the left knee or try the zilretta both knees.

## 2023-08-13 DIAGNOSIS — G5601 Carpal tunnel syndrome, right upper limb: Secondary | ICD-10-CM | POA: Insufficient documentation

## 2023-08-13 DIAGNOSIS — M170 Bilateral primary osteoarthritis of knee: Secondary | ICD-10-CM | POA: Insufficient documentation

## 2023-08-13 DIAGNOSIS — G8929 Other chronic pain: Secondary | ICD-10-CM | POA: Insufficient documentation

## 2023-08-13 NOTE — Telephone Encounter (Signed)
VOB initiated for Orthovisc and Monovisc for LEFT knee OA  Also checking Zilretta for BILAT knee OA.

## 2023-08-13 NOTE — Telephone Encounter (Signed)
VOB initiated for ZILRETTA for BILAT knee OA.

## 2023-08-14 NOTE — Telephone Encounter (Signed)
Pending

## 2023-08-14 NOTE — Telephone Encounter (Signed)
Prior Authorization initiated for [facility] via Availity/[employer] Case ID: [id number]   Pending review

## 2023-08-14 NOTE — Telephone Encounter (Signed)
Prior Auth required for [employer]

## 2023-08-15 NOTE — Telephone Encounter (Signed)
Still pending tech review.

## 2023-08-18 NOTE — Telephone Encounter (Signed)
VOB initiated for Durolane for LEFT knee OA

## 2023-08-18 NOTE — Telephone Encounter (Signed)
Will change to Durolane and check benefits.

## 2023-08-18 NOTE — Telephone Encounter (Signed)
APPROVED PA# [id number] Valid: 08/14/23-11/13/23

## 2023-08-18 NOTE — Telephone Encounter (Signed)
ZILRETTA for BILAT knee OA   Primary Insurance: [employer]na Medicare Advantage PPO Co-pay: $40 Co-insurance: 20% Deductible: $100 of $100 met  Prior Auth: APPROVED PA# [id number] Valid: 08/14/23-11/13/23  Knee Injection History Visco with Ortho

## 2023-08-18 NOTE — Telephone Encounter (Signed)
Prior Auth REQUIRED for [employer] and ORTHOVISC   Monovisc    Orthovisc

## 2023-08-19 ENCOUNTER — Ambulatory Visit (INDEPENDENT_AMBULATORY_CARE_PROVIDER_SITE_OTHER): Payer: Medicare HMO | Admitting: Family Medicine

## 2023-08-19 ENCOUNTER — Other Ambulatory Visit: Payer: Self-pay

## 2023-08-19 DIAGNOSIS — G8929 Other chronic pain: Secondary | ICD-10-CM

## 2023-08-19 DIAGNOSIS — M1711 Unilateral primary osteoarthritis, right knee: Secondary | ICD-10-CM

## 2023-08-19 DIAGNOSIS — M25561 Pain in right knee: Secondary | ICD-10-CM | POA: Diagnosis not present

## 2023-08-19 MED ORDER — TRIAMCINOLONE ACETONIDE 32 MG IX SRER
32.0000 mg | Freq: Once | INTRA_ARTICULAR | Status: AC
Start: 2023-08-19 — End: 2023-08-19
  Administered 2023-08-19: 32 mg via INTRA_ARTICULAR

## 2023-08-19 NOTE — Telephone Encounter (Signed)
Prior Auth REQUIRED for [employer]

## 2023-08-19 NOTE — Telephone Encounter (Signed)
Given today.

## 2023-08-19 NOTE — Patient Instructions (Addendum)
Thank you for coming in today.   Schedule next week whenever Shelley [name] is coming to look at your wrist  You received an injection today. Seek immediate medical attention if the joint becomes red, extremely painful, or is oozing fluid.

## 2023-08-19 NOTE — Telephone Encounter (Signed)
Prior Authorization initiated for [facility] via Availity/[employer] Case ID: [id number]  Pending review

## 2023-08-19 NOTE — Progress Notes (Signed)
  Zilretta injection right knee Procedure: Real-time Ultrasound Guided Injection of right knee joint superior lateral patellar space Device: Philips Affiniti 50G Images permanently stored and available for review in PACS Verbal informed consent obtained.  Discussed risks and benefits of procedure. Warned about infection, hyperglycemia bleeding, damage to structures among others. Patient expresses understanding and agreement Time-out conducted.   Noted no overlying erythema, induration, or other signs of local infection.   Skin prepped in a sterile fashion.   Local anesthesia: Topical Ethyl chloride.   With sterile technique and under real time ultrasound guidance: Zilretta 32 mg injected into knee joint. Fluid seen entering the joint capsule.   Completed without difficulty   Advised to call if fevers/chills, erythema, induration, drainage, or persistent bleeding.   Images permanently stored and available for review in the ultrasound unit.  Impression: Technically successful ultrasound guided injection.  Lot number: [id number]  She will schedule next week for potentially planned bilateral carpal tunnel injections.

## 2023-08-19 NOTE — Progress Notes (Signed)
Note duplication

## 2023-08-21 NOTE — Telephone Encounter (Signed)
DUROLANE for LEFT knee OA   Primary Insurance: [employer] Adv ESA PPO Co-pay: $40 Co-insurance: 20% Deductible: $100 of $100 met  Prior Auth: APPROVED PA# [id number]  Valid: 08/19/23-02/16/24   Knee Injection History 08/21/23 - Zilretta RIGHT

## 2023-08-21 NOTE — Telephone Encounter (Signed)
APPROVED PA# [id number]  Valid: 08/19/23-02/16/24

## 2023-08-21 NOTE — Telephone Encounter (Signed)
Left message for patient to call back to schedule. This will need to be ordered.

## 2023-08-25 NOTE — Progress Notes (Signed)
Left knee x-ray shows mild arthritis.  Worse on the inside part of the knee.

## 2023-08-25 NOTE — Progress Notes (Signed)
Right knee x-ray shows arthritis especially severe on the inside part of the knee.

## 2023-08-25 NOTE — Telephone Encounter (Signed)
Pt received Zilretta injection for RIGHT knee OA on 08/19/23.  Can consider repeat injection on or after 11/12/23

## 2023-08-26 ENCOUNTER — Ambulatory Visit: Payer: Medicare HMO | Admitting: Family Medicine

## 2023-08-26 ENCOUNTER — Other Ambulatory Visit: Payer: Self-pay

## 2023-08-26 VITALS — BP 124/70 | HR 67 | Ht 59.0 in

## 2023-08-26 DIAGNOSIS — G5601 Carpal tunnel syndrome, right upper limb: Secondary | ICD-10-CM

## 2023-08-26 NOTE — Telephone Encounter (Signed)
Scheduled 08/26/23

## 2023-08-26 NOTE — Patient Instructions (Signed)
Thank you for coming in today.   Call or go to the ER if you develop a large red swollen joint with extreme pain or oozing puss.    Let me know how this goes.   Continue the wrist brace.   I can do either injection every 3 months if needed.

## 2023-08-26 NOTE — Progress Notes (Signed)
   I, Shelley [name], CMA acting as a scribe for Shelley Graham, MD.  Shelley [name] is a 89 y.o. female who presents to [facility] at [facility] today for carpal tunnel syndrome. Pt was last seen by Dr. Denyse [name] on 08/19/23 for right knee pain. Today patient states that her Right wrist is causing her some issues where her right fingers go numb, has been going on for a long time. Patient hs been using  at night splint to help at night. Patient has notices since she got her knee injection at her last visit her wrist does not bother her as much and she doesn't have to wear her night splint. She is hoping that the injection in the wrist will knock the rest of the pain away.    Pertinent review of systems: No fevers or chills  Relevant historical information: Hypertension   Exam:  BP 124/70   Pulse 67   Ht 4\' 11"  (1.499 m)   LMP  (LMP Unknown)   SpO2 97%   BMI 33.33 kg/m  General: Well Developed, well nourished, and in no acute distress.   MSK: Right wrist normal. Mildly positive Tinel's carpal tunnel.  Normal motion and strength.    Lab and Radiology Results  Procedure: Real-time Ultrasound Guided hydrodissection median nerve right carpal tunnel   Device: Philips Affiniti 50G/GE Logiq Images permanently stored and available for review in PACS Verbal informed consent obtained.  Discussed risks and benefits of procedure. Warned about infection, bleeding, hyperglycemia damage to structures among others. Patient expresses understanding and agreement Time-out conducted.   Noted no overlying erythema, induration, or other signs of local infection.   Skin prepped in a sterile fashion.   Local anesthesia: Topical Ethyl chloride.   With sterile technique and under real time ultrasound guidance: 40 mg of Kenalog and 1 mL of lidocaine injected into right carpal tunnel around median nerve. Fluid seen entering the carpal tunnel.   Completed without difficulty   Pain  immediately resolved suggesting accurate placement of the medication.   Advised to call if fevers/chills, erythema, induration, drainage, or persistent bleeding.   Images permanently stored and available for review in the ultrasound unit.  Impression: Technically successful ultrasound guided injection.        Assessment and Plan: 89 y.o. female with right carpal tunnel syndrome.  Plan for carpal tunnel injections/median nerve hydrodissection today.  Continue wrist bracing and check back as needed.  Of note knee pain is significantly improved with Zilretta injection.   PDMP not reviewed this encounter. Orders Placed This Encounter  Procedures   [place] LIMITED JOINT SPACE STRUCTURES UP RIGHT(NO LINKED CHARGES)    Order Specific Question:   Reason for Exam (SYMPTOM  OR DIAGNOSIS REQUIRED)    Answer:   right carpal tunnel    Order Specific Question:   Preferred imaging location?    Answer:   [facility]   No orders of the defined types were placed in this encounter.    Discussed warning signs or symptoms. Please see discharge instructions. Patient expresses understanding.   The above documentation has been reviewed and is accurate and complete Shelley [name], M.D.

## 2023-08-28 NOTE — Telephone Encounter (Signed)
Pt received Zilretta inj for RIGHT knee OA on 08/19/23. Can consider repeat inj on or after 11/12/23.

## 2023-10-24 NOTE — Telephone Encounter (Signed)
VOB initiated for Durolane for RIGHT knee OA.

## 2023-10-28 NOTE — Telephone Encounter (Signed)
Prior Auth / Pre-determination REQUIRED for [facility] for RIGHT knee OA.

## 2023-10-29 NOTE — Telephone Encounter (Signed)
DUROLANE for LEFT knee OA   Primary Insurance: [employer] Adv ESA PPO Co-pay: $40 Co-insurance: 20% Deductible: $100 of $100 met  Prior Auth: APPROVED PA# [id number]  Valid: 08/19/23-02/16/24   Knee Injection History 08/21/23 - Zilretta RIGHT

## 2023-10-29 NOTE — Telephone Encounter (Addendum)
Prior Auth on file and valid BUY AND BILL  Prior Auth: APPROVED PA# [id number]  Valid: 08/19/23-02/16/24

## 2023-11-03 NOTE — Telephone Encounter (Signed)
VOB initiated for ZILRETTA for RIGHT knee OA.

## 2023-11-03 NOTE — Telephone Encounter (Signed)
Left message for patient to call back to schedule if she would like to proceed. Also checking Zilretta.

## 2023-11-04 NOTE — Telephone Encounter (Signed)
    Buy and [employer] may consider [phone] Quarry manager) when the criteria listed in their Clinical Policy is met. The policy can be viewed at the payer's website using Medical Policy Number: [id number]. A pre-certification is required for this patient's plan. To initiate pre-certification please fax the attached pre-certification form and medical records to the [facility] Pre-Certification Department directly at [phone]. To verbally initiate the Pre-Certification process, the office must contact [phone]. No medical notes or referrals needed. Patient has a Fully [employer] Advantage PPO plan with an effective date of 12/02/2022. Plan follows Medicare guidelines. Patient responsibility for [phone] Arletta Bale) will be 20% with the remaining covered at 80% by the payer at the contracted rate. Patient is responsible for a $40 copay for CPT code 28413 with the remaining covered at 100% by the payer at contracted rate. Patient has a deductible of $100. Deductible has been met. Patient has a $40 copay whether or not an office visit is billed. Only one copay applies per date of service. Patient has an out of pocket maximum of $4000 and has accumulated $826.72. If out of pocket is met, coverage goes to 100% and copays will no longer apply. Plan renews on 12/03/2023. [employer] recommends that the provider resubmit for dates of service after 12/03/2023. Benefits for dates of service after 12/03/2023 may vary.   Specialty Pharmacy:  No prior authorization is required through the patient's prescription plan. Patient has a Pharmacy deductible of $300. Deductible has been met. Patient has a Pharmacy out of pocket max of $8000 and has met $330.25. Patient has a $200 copay for the medication. The pharmacy will confirm the patient responsibility when the prescription is processed. This patient has the option to obtain ZILRETTA at [facility]. If you would like to obtain the medication  through [facility], please contact us at [phone]

## 2023-11-06 NOTE — Telephone Encounter (Signed)
Prior Auth for [facility] for RIGHT knee OA initiated via Availity Authorization Number : [id number]

## 2023-11-07 NOTE — Telephone Encounter (Signed)
Clarification fax received from [place].   Form completed and faxed back.

## 2023-11-07 NOTE — Telephone Encounter (Signed)
Shelley [name]--Shelley [name]  Nicole Cella [phone]

## 2023-11-11 NOTE — Telephone Encounter (Signed)
Forwarding to Dr. Denyse Amass as Lorain Childes.   Pt has been approved for Durolane Prior Auth: APPROVED PA# [id number]  Valid: 08/19/23-02/16/24

## 2023-11-11 NOTE — Telephone Encounter (Signed)
Prior Auth for ZILRETTA DENIED  Neither Medicare nor your Medicare Adv health plan covers this drug for your condition at the frequency your doctor has prescribed. Medical studies have not proven that this drug frequency (repeat injection) is safe and helpful for treatment of your health problem.

## 2023-11-12 ENCOUNTER — Ambulatory Visit (HOSPITAL_COMMUNITY): Payer: Medicare HMO | Attending: Cardiology

## 2023-11-12 DIAGNOSIS — I350 Nonrheumatic aortic (valve) stenosis: Secondary | ICD-10-CM | POA: Diagnosis present

## 2023-11-12 LAB — ECHOCARDIOGRAM COMPLETE
AR max vel: 1.1 cm2
AV Area VTI: 1.06 cm2
AV Area mean vel: 1.02 cm2
AV Mean grad: 25 mm[Hg]
AV Peak grad: 41.9 mm[Hg]
Ao pk vel: 3.24 m/s
Area-P 1/2: 2.85 cm2
S' Lateral: 2.6 cm

## 2023-11-12 NOTE — Telephone Encounter (Signed)
Please inform patient Shelley [name] is approved but Arletta Bale is denied and we can proceed to injection.

## 2023-11-19 ENCOUNTER — Telehealth: Payer: Self-pay | Admitting: Cardiovascular Disease

## 2023-11-19 NOTE — Telephone Encounter (Signed)
Pt returning call for echo results  

## 2023-11-27 NOTE — Telephone Encounter (Signed)
Will need to re-run benefits for 2025.

## 2023-12-12 NOTE — Telephone Encounter (Signed)
VOB initiated for Durolane

## 2023-12-18 NOTE — Telephone Encounter (Signed)
Medical Buy and [facility]y for LEFT knee OA   Primary Insurance: [employer]na Medicare Adv PPO Co-pay: $40 Co-insurance: 20% Deductible: $0 of $100 met - must be met for coverage to apply Prior Auth: NOT required   Knee Injection History 08/19/23 - Zilretta RIGHT

## 2023-12-18 NOTE — Telephone Encounter (Signed)
Medical Buy and Annette Stable - Prior Authorization NOT required  Pharmacy - Prior Authorization NOT required

## 2024-01-15 ENCOUNTER — Telehealth: Payer: Self-pay | Admitting: Cardiovascular Disease

## 2024-01-15 NOTE — Telephone Encounter (Signed)
Pt c/o BP issue:  1. What are your last 5 BP readings? 182/81, pulse 53  142/70 pulse 59  2. Are you having any other symptoms (ex. Dizziness, headache, blurred vision, passed out)? ankles are swelling  3. What is your medication issue? Blood pressure is running- not taking the medicine( correctly (Carvedilol) that her primary doctor put her on

## 2024-01-15 NOTE — Telephone Encounter (Signed)
Pt states she wants to ask Dr. Royann Shivers what blood pressure medications. Pt advised her PCP is handling her blood pressure medications. She can not go between the two providers she has to have opnly one manage her blood pressure medications. She verbalized understanding. Follow up appt made wit Dr. Royann Shivers.

## 2024-03-01 ENCOUNTER — Encounter: Payer: Self-pay | Admitting: Otolaryngology

## 2024-03-08 ENCOUNTER — Encounter: Payer: Self-pay | Admitting: Cardiovascular Disease

## 2024-03-08 ENCOUNTER — Ambulatory Visit: Payer: Medicare HMO | Attending: Cardiovascular Disease | Admitting: Cardiovascular Disease

## 2024-03-08 VITALS — BP 122/62 | HR 63 | Ht 59.0 in | Wt 161.0 lb

## 2024-03-08 DIAGNOSIS — I10 Essential (primary) hypertension: Secondary | ICD-10-CM | POA: Diagnosis not present

## 2024-03-08 DIAGNOSIS — I350 Nonrheumatic aortic (valve) stenosis: Secondary | ICD-10-CM

## 2024-03-08 DIAGNOSIS — E7800 Pure hypercholesterolemia, unspecified: Secondary | ICD-10-CM

## 2024-03-08 NOTE — Patient Instructions (Signed)
 Medication Instructions:  No changes *If you need a refill on your cardiac medications before your next appointment, please call your pharmacy*   Testing/Procedures: Your physician has requested that you have an Echocardiogram in December. Echocardiography is a painless test that uses sound waves to create images of your heart. It provides your doctor with information about the size and shape of your heart and how well your heart's chambers and valves are working. This procedure takes approximately one hour. There are no restrictions for this procedure. Please do NOT wear cologne, perfume, aftershave, or lotions (deodorant is allowed). Please arrive 15 minutes prior to your appointment time.  Please note: We ask at that you not bring children with you during ultrasound (echo/ vascular) testing. Due to room size and safety concerns, children are not allowed in the ultrasound rooms during exams. Our front office staff cannot provide observation of children in our lobby area while testing is being conducted. An adult accompanying a patient to their appointment will only be allowed in the ultrasound room at the discretion of the ultrasound technician under special circumstances. We apologize for any inconvenience.   Follow-Up: At [facility], you and your health needs are our priority.  As part of our continuing mission to provide you with exceptional heart care, our providers are all part of one team.  This team includes your primary Cardiologist (physician) and Advanced Practice Providers or APPs (Physician Assistants and Nurse Practitioners) who all work together to provide you with the care you need, when you need it.  Your next appointment:   1 year(s)  Provider:   Thurmon Fair, MD     We recommend signing up for the patient portal called "MyChart".  Sign up information is provided on this After Visit Summary.  MyChart is used to connect with patients for Virtual Visits  (Telemedicine).  Patients are able to view lab/test results, encounter notes, upcoming appointments, etc.  Non-urgent messages can be sent to your provider as well.   To learn more about what you can do with MyChart, go to [email or web address].         1st Floor: - Lobby - Registration  - Pharmacy  - Lab - Cafe  2nd Floor: - PV Lab - Diagnostic Testing (echo, CT, nuclear med)  3rd Floor: - Vacant  4th Floor: - TCTS (cardiothoracic surgery) - AFib Clinic - Structural Heart Clinic - Vascular Surgery  - Vascular Ultrasound  5th Floor: - HeartCare Cardiology (general and EP) - Clinical Pharmacy for coumadin, hypertension, lipid, weight-loss medications, and med management appointments    Valet parking services will be available as well.

## 2024-03-08 NOTE — Progress Notes (Unsigned)
 Cardiology Office Note:    Date:  03/09/2024   ID:  Shelley [name], DOB 05-06-1932, MRN [id number]  PCP:  Thana Ates, MD  Cardiologist:  Thurmon Fair, MD   Referring MD: Thana Ates, MD   Chief Complaint  Patient presents with   Cardiac Valve Problem    History of Present Illness:    Shelley [name] is a 89 y.o. female with a hx of moderate aortic stenosis, hypothyroidism, hypertension and hyperlipidemia.  She always accompanied her late husband, Leilani Merl to his office appointments; he passed away about 7 years ago.   She generally doing well but complains of fatigue when walking.  Part of the problem is pain in her right knee but this has improved by wearing a brace and she has received a cortisone shot.  Throughout the spring she has had a lot of problems with upper airway congestion and is seeing a ear nose and throat specialist soon.  She has not had dyspnea at rest with activity or any chest pain at rest or with activity.  She has not experienced any recent syncope.  Not exercising as much as she did in the past because she is always busy.  She has remained socially engaged.  Her blood pressure is very well-controlled.  In the past amlodipine caused severe lower extremity edema and hydrochlorothiazide had to be stopped due to muscle cramps and lab abnormalities.  She is currently taking the highest dose of olmesartan as well as a low-dose of carvedilol and 3 times daily low-dose hydralazine.  Her echocardiograms have shown very slow progression of aortic stenosis.  On the echo from December 2024 the mean gradient was 25 mmHg and aortic valve area was 1.06 cm and the dimensionless valve index was 0.34.  She has normal stroke-volume/cardiac output  A few years ago she had a fall with a pattern suggesting orthostatic hypotension, may be related to relative hypovolemia.  She had a bad leg cramp and try to get out of bed to walk it off after which she fell.  It sounds  like she briefly lost consciousness.  CT head was okay.  She was found to have abnormalities in her electrolytes and hydrochlorothiazide was discontinued.         Past Medical History:  Diagnosis Date   Hyperlipidemia    Hypertension    Thyroid disease     No past surgical history on file.  Current Medications: Current Meds  Medication Sig   calcium carbonate (OS-CAL) 600 MG TABS tablet Take 600 mg by mouth daily with breakfast.   carvedilol (COREG) 3.125 MG tablet Take 3.125 mg by mouth 2 (two) times daily.   Cholecalciferol 25 MCG (1000 UT) capsule Take 1,000 Units by mouth daily.   escitalopram (LEXAPRO) 10 MG tablet Take 5 mg by mouth every other day. Take 1/2 tablet every other day for two weeks   hydrALAZINE (APRESOLINE) 25 MG tablet Take 25 mg by mouth 3 (three) times daily.   levothyroxine (SYNTHROID) 50 MCG tablet Take 50 mcg by mouth every other day.   levothyroxine (SYNTHROID, LEVOTHROID) 75 MCG tablet Take 75 mcg by mouth every other day.   melatonin 1 MG TABS tablet Take 1 mg by mouth at bedtime.   olmesartan (BENICAR) 40 MG tablet Take 40 mg by mouth daily.   Omega 3 1000 MG CAPS Take 1 capsule by mouth daily.   omeprazole (PRILOSEC) 40 MG capsule Take 40 mg by mouth as needed.  rosuvastatin (CRESTOR) 20 MG tablet TAKE 1 TABLET DAILY (REPLACES SIMVASTATIN)   traZODone (DESYREL) 50 MG tablet Take 50 mg by mouth at bedtime.     Allergies:   Amlodipine besylate  Family History: Negative for early onset coronary or vascular disease  EKGs/Labs/Other Studies Reviewed:    The following studies were reviewed today: Echocardiogram 11/12/2023  1. Left ventricular ejection fraction, by estimation, is 60 to 65%. The  left ventricle has normal function. The left ventricle has no regional  wall motion abnormalities. There is moderate concentric left ventricular  hypertrophy. Left ventricular  diastolic parameters are consistent with Grade I diastolic dysfunction   (impaired relaxation). The average left ventricular global longitudinal  strain is 17.9 %. The global longitudinal strain is abnormal.   2. Right ventricular systolic function is normal. The right ventricular  size is normal. There is mildly elevated pulmonary artery systolic  pressure. The estimated right ventricular systolic pressure is 40.9 mmHg.   3. Left atrial size was moderately dilated.   4. The mitral valve is normal in structure. Mild mitral valve  regurgitation. No evidence of mitral stenosis. Moderate mitral annular  calcification.   5. The tricuspid valve is abnormal. Tricuspid valve regurgitation is  moderate.   6. The aortic valve has an indeterminant number of cusps. There is severe  calcifcation of the aortic valve. Aortic valve regurgitation is not  visualized. Moderate aortic valve stenosis. Aortic valve area, by VTI  measures 1.06 cm. Aortic valve mean  gradient measures 25.0 mmHg.   7. The inferior vena cava is normal in size with greater than 50%  respiratory variability, suggesting right atrial pressure of 3 mmHg.   AV Area (VTI):     1.06 cm  AV Vmax:           323.67 cm/s  AV Peak Grad:      41.9 mmHg  AV Mean Grad:      25.0 mmHg   LVOT/AV VTI ratio: 0.34  LV SV Index:   47    EKG:    EKG Interpretation Date/Time:  Monday March 08 2024 10:47:46 EDT Ventricular Rate:  63 PR Interval:  186 QRS Duration:  94 QT Interval:  422 QTC Calculation: 431 R Axis:   19  Text Interpretation: Normal sinus rhythm with sinus arrhythmia Normal ECG No previous ECGs available Confirmed by Monnica Saltsman ([id number]) on 03/08/2024 10:50:07 AM        Recent Labs: 11/09/2021 Hemoglobin 13.1, creatinine 0.6, potassium 4.6, ALT 14, TSH 0.54  05/24/2022 Creatinine 1.02, potassium 3.9, ALT 13, TSH 0.96, hemoglobin 12.7 12/23/2023 Hemoglobin 13.7, potassium 4.5, ALT 10, TSH 1.380 Recent Lipid Panel 11/09/2021 Cholesterol 176, HDL 54, LDL 94, triglycerides  164  12/25/2022 Chol 176, HDL 54, LDL 90, TG 189  12/23/2023 Cholesterol 166, HDL 53, LDL 88, triglycerides 147   Physical Exam:    VS:  BP 122/62 (BP Location: Left Arm, Patient Position: Sitting)   Pulse 63   Ht 4\' 11"  (1.499 m)   Wt 161 lb (73 kg)   LMP  (LMP Unknown)   SpO2 96%   BMI 32.52 kg/m     Wt Readings from Last 3 Encounters:  03/08/24 161 lb (73 kg)  08/12/23 165 lb (74.8 kg)  06/20/23 164 lb 9.6 oz (74.7 kg)       General: Alert, oriented x3, no distress, appears younger than stated age Head: no evidence of trauma, PERRL, EOMI, no exophtalmos or lid lag, no myxedema, no xanthelasma;  normal ears, nose and oropharynx Neck: normal jugular venous pulsations and no hepatojugular reflux; carotid pulses appear brisk and there are bilateral bruits radiating from the chest Chest: clear to auscultation, no signs of consolidation by percussion or palpation, normal fremitus, symmetrical and full respiratory excursions Cardiovascular: normal position and quality of the apical impulse, regular rhythm, normal first and still a very distinct second heart sounds, 3/6 early-mid peaking aortic ejection murmur, no breast murmurs, rubs or gallops Abdomen: no tenderness or distention, no masses by palpation, no abnormal pulsatility or arterial bruits, normal bowel sounds, no hepatosplenomegaly Extremities: no clubbing, cyanosis or edema; 2+ radial, ulnar and brachial pulses bilaterally; 2+ right femoral, posterior tibial and dorsalis pedis pulses; 2+ left femoral, posterior tibial and dorsalis pedis pulses; no subclavian or femoral bruits Neurological: grossly nonfocal Psych: Normal mood and affect     ASSESSMENT:    1. Nonrheumatic aortic (valve) stenosis   2. Essential hypertension   3. Hypercholesterolemia      PLAN:    In order of problems listed above:  HTN: After several changes in her medications she seems to have reached reasonable control blood pressure on a  combination of olmesartan 40 mg daily plus carvedilol 3.125 mg twice daily and hydralazine 25 mg 3 times daily.  Continue these medications.   AS: Slowly worsening but by all criteria both clinical and echo remains in moderate range.  Anticipate it will be a couple of years still she requires TAVR.  Despite her advanced age she is quite vibrant and active and I think she would be a good candidate for TAVR.  Reminded her that she needs to stay physically active and to warn [place] promptly if she develops exertional dyspnea/angina/syncope.  Continue with yearly echocardiograms  HLP: She does not have known CAD or PAD.  Continue the current statin with a target LDL less than 100.    Medication Adjustments/Labs and Tests Ordered: Current medicines are reviewed at length with the patient today.  Concerns regarding medicines are outlined above.  Orders Placed This Encounter  Procedures   EKG 12-Lead   ECHOCARDIOGRAM COMPLETE   No orders of the defined types were placed in this encounter.   Patient Instructions  Medication Instructions:  No changes *If you need a refill on your cardiac medications before your next appointment, please call your pharmacy*   Testing/Procedures: Your physician has requested that you have an Echocardiogram in December. Echocardiography is a painless test that uses sound waves to create images of your heart. It provides your doctor with information about the size and shape of your heart and how well your heart's chambers and valves are working. This procedure takes approximately one hour. There are no restrictions for this procedure. Please do NOT wear cologne, perfume, aftershave, or lotions (deodorant is allowed). Please arrive 15 minutes prior to your appointment time.  Please note: We ask at that you not bring children with you during ultrasound (echo/ vascular) testing. Due to room size and safety concerns, children are not allowed in the ultrasound rooms during  exams. Our front office staff cannot provide observation of children in our lobby area while testing is being conducted. An adult accompanying a patient to their appointment will only be allowed in the ultrasound room at the discretion of the ultrasound technician under special circumstances. We apologize for any inconvenience.   Follow-Up: At [facility], you and your health needs are our priority.  As part of our continuing mission to provide you with  exceptional heart care, our providers are all part of one team.  This team includes your primary Cardiologist (physician) and Advanced Practice Providers or APPs (Physician Assistants and Nurse Practitioners) who all work together to provide you with the care you need, when you need it.  Your next appointment:   1 year(s)  Provider:   Thurmon Fair, MD     We recommend signing up for the patient portal called "MyChart".  Sign up information is provided on this After Visit Summary.  MyChart is used to connect with patients for Virtual Visits (Telemedicine).  Patients are able to view lab/test results, encounter notes, upcoming appointments, etc.  Non-urgent messages can be sent to your provider as well.   To learn more about what you can do with MyChart, go to [email or web address].         1st Floor: - Lobby - Registration  - Pharmacy  - Lab - Cafe  2nd Floor: - PV Lab - Diagnostic Testing (echo, CT, nuclear med)  3rd Floor: - Vacant  4th Floor: - TCTS (cardiothoracic surgery) - AFib Clinic - Structural Heart Clinic - Vascular Surgery  - Vascular Ultrasound  5th Floor: - HeartCare Cardiology (general and EP) - Clinical Pharmacy for coumadin, hypertension, lipid, weight-loss medications, and med management appointments    Valet parking services will be available as well.      Signed, Thurmon Fair, MD  03/09/2024 3:30 PM    [facility]

## 2024-05-11 ENCOUNTER — Ambulatory Visit (INDEPENDENT_AMBULATORY_CARE_PROVIDER_SITE_OTHER): Admitting: Otolaryngology

## 2024-05-11 ENCOUNTER — Encounter (INDEPENDENT_AMBULATORY_CARE_PROVIDER_SITE_OTHER): Payer: Self-pay | Admitting: Otolaryngology

## 2024-05-11 VITALS — BP 165/82 | HR 81 | Ht 60.0 in | Wt 163.0 lb

## 2024-05-11 DIAGNOSIS — H6993 Unspecified Eustachian tube disorder, bilateral: Secondary | ICD-10-CM

## 2024-05-11 DIAGNOSIS — R490 Dysphonia: Secondary | ICD-10-CM

## 2024-05-11 DIAGNOSIS — H9190 Unspecified hearing loss, unspecified ear: Secondary | ICD-10-CM

## 2024-05-11 DIAGNOSIS — J383 Other diseases of vocal cords: Secondary | ICD-10-CM

## 2024-05-11 MED ORDER — FLUTICASONE PROPIONATE 50 MCG/ACT NA SUSP: 2.0000 | Freq: Every day | NASAL | 6 refills | Status: AC

## 2024-05-11 MED ORDER — AZELASTINE HCL 0.1 % NA SOLN: 2.0000 | Freq: Every day | NASAL | 12 refills | Status: AC

## 2024-05-11 NOTE — Progress Notes (Signed)
 Dear Dr. Candi Chafe, Here is my assessment for our mutual patient, Shelley [name]. Thank you for allowing me the opportunity to care for your patient. Please do not hesitate to contact me should you have any other questions. Sincerely, Dr. Milon Aloe  Otolaryngology Clinic Note Referring provider: Dr. Candi Chafe HPI:  Shelley [name] is a 89 y.o. female kindly referred by Dr. Candi Chafe for evaluation of dysphonia and ear fullness.  Initial visit (05/2024): Patient reports: noted bilateral ear fullness (like getting off an airplane). Ongoing for several years, intermittent/rare discomfort. Some hearing loss. No history of ear surgery. She does not use nasal medications, denies allergy sx. No frequent sinus infections - denies cardinal CRS symptoms.  Other issues include: intermittent dysphonia, but slowly worsening over last 3 years. Never fully loses her voice. Voice is harsher at end of day and after use. It does not bother her significantly from vocal demand standpoint. Rest makes it better. No recent intubations, diagnosed neuromuscular disorders. Denies GERD sx Water use: ~32 oz.  Patient otherwise denies: - dysphagia, odynophagia, PNA, unintentional weight loss - shortness of breath, hemoptysis -  neck masses  H&N Surgery: denies Personal or FHx of bleeding dz or anesthesia difficulty: no   Tobacco: denies history of use  PMHx: HLD, HTN, Aortic stenosis, Hypothyroidism, Anxiety  Independent Review of Additional Tests or Records:  Dr. Ralston Burkes (10/30/2022): noted ear plugged, several years. Some HL; no recent HT; intermittent dysphonia, no trouble swallowing; intermittent nasal congestion. Dx: ETD, SNHL, Dysphonia; Rx: defer TFL, saline spray; f/u as needed Dr. Candi Chafe referral notes 02/11/2024: noted ongoing hoarseness, over last 1-2 years; noted discomfort in ears/fullness. Dx: Hoarseness, ear fullness? Rx: ref to ENT PMH/Meds/All/SocHx/FamHx/ROS:   Past Medical History:  Diagnosis Date    Hyperlipidemia    Hypertension    Thyroid  disease      History reviewed. No pertinent surgical history.  History reviewed. No pertinent family history.   Social Connections: Not on file      Current Outpatient Medications:    azelastine  (ASTELIN ) 0.1 % nasal spray, Place 2 sprays into both nostrils daily. Use in each nostril as directed, Disp: 30 mL, Rfl: 12   calcium  carbonate (OS-CAL) 600 MG TABS tablet, Take 600 mg by mouth daily with breakfast., Disp: , Rfl:    carvedilol (COREG) 3.125 MG tablet, Take 3.125 mg by mouth 2 (two) times daily., Disp: , Rfl:    Cholecalciferol 25 MCG (1000 UT) capsule, Take 1,000 Units by mouth daily., Disp: , Rfl:    fluticasone  (FLONASE ) 50 MCG/ACT nasal spray, Place 2 sprays into both nostrils daily., Disp: 16 g, Rfl: 6   hydrALAZINE (APRESOLINE) 25 MG tablet, Take 25 mg by mouth 3 (three) times daily., Disp: , Rfl:    levothyroxine (SYNTHROID) 50 MCG tablet, Take 50 mcg by mouth every other day., Disp: , Rfl:    levothyroxine (SYNTHROID, LEVOTHROID) 75 MCG tablet, Take 75 mcg by mouth every other day., Disp: , Rfl:    melatonin 1 MG TABS tablet, Take 1 mg by mouth at bedtime., Disp: , Rfl:    olmesartan (BENICAR) 40 MG tablet, Take 40 mg by mouth daily., Disp: , Rfl:    Omega 3 1000 MG CAPS, Take 1 capsule by mouth daily., Disp: , Rfl:    omeprazole (PRILOSEC) 40 MG capsule, Take 40 mg by mouth as needed., Disp: , Rfl:    rosuvastatin  (CRESTOR ) 20 MG tablet, TAKE 1 TABLET DAILY (REPLACES SIMVASTATIN), Disp: 90 tablet, Rfl: 3   traZODone (DESYREL)  50 MG tablet, Take 50 mg by mouth at bedtime., Disp: , Rfl:    escitalopram (LEXAPRO) 10 MG tablet, Take 5 mg by mouth every other day. Take 1/2 tablet every other day for two weeks (Patient not taking: Reported on 05/11/2024), Disp: , Rfl:    Physical Exam:   BP (!) 165/82 (BP Location: Left Arm, Patient Position: Sitting, Cuff Size: Normal)   Pulse 81   Ht 5' (1.524 m)   Wt 163 lb (73.9 kg)   LMP  (LMP  Unknown)   SpO2 95%   BMI 31.83 kg/m   Salient findings:  CN II-XII intact Bilateral EAC clear and TM intact with well pneumatized middle ear spaces Anterior rhinoscopy: Septum intact; bilateral inferior turbinates without significant hypertrophy No lesions of oral cavity/oropharynx No obviously palpable neck masses No respiratory distress or stridor; voice quality class 2 - slight harshness, no breathiness; TFL was indicated to better evaluate the proximal airway, given the patient's history and exam findings, and is detailed below.  Seprately Identifiable Procedures:  Prior to initiating any procedures, risks/benefits/alternatives were explained to the patient and verbal consent obtained. Procedure Note Pre-procedure diagnosis:  Dysphonia Post-procedure diagnosis: Same Procedure: Transnasal Fiberoptic Laryngoscopy, CPT 31575 - Mod 25 Indication: see above Complications: None apparent EBL: 0 mL  The procedure was undertaken to further evaluate the patient's complaint above, with mirror exam inadequate for appropriate examination due to gag reflex and poor patient tolerance  Procedure:  Patient was identified as correct patient. Verbal consent was obtained. The nose was sprayed with oxymetazoline and 4% lidocaine. The The flexible laryngoscope was passed through the nose to view the nasal cavity, pharynx (oropharynx, hypopharynx) and larynx.  The larynx was examined at rest and during multiple phonatory tasks. Documentation was obtained and reviewed with patient. The scope was removed. The patient tolerated the procedure well.  Findings: The nasal cavity and nasopharynx did not reveal any masses or lesions, mucosa appeared to be without obvious lesions. The tongue base, pharyngeal walls, piriform sinuses, vallecula, epiglottis and postcricoid region are normal in appearance without significant retained secretions. The visualized portion of the subglottis and proximal trachea is widely  patent. The vocal folds are mobile bilaterally - left slightly hypomobile. Noted bilateral age appropriate vocal fold hypertrophy and likely 1-2 mm glottal gap with likely hourglass shaped closure. There are no lesions on the free edge of the vocal folds nor elsewhere in the larynx worrisome for malignancy. Modest AP compression consistent with muscle tension dysphonia  Electronically signed by: Evelina Hippo, MD 05/16/2024 5:01 PM   Impression & Plans:  Shelley [name] is a 89 y.o. female with:  1. Dysfunction of both eustachian tubes   2. Subjective hearing loss   3. Dysphonia   4. Vocal fold atrophy   5. Muscle tension dysphonia    Multiple issues today - her sx are most likely suggestive of ETD with HL; d/w pt re: audio, but she declined. We discussed mgmt and trial of flonase  and astelin  BID for 8 weeks; if sx improve, can continue; if not, can stop. 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 We discussed mgmt for this including voice therapy, augmentation, and observation. Overall, this is not significantly bothersome and so she will think about what she'd like for the next steps to be. If she'd like to proceed with voice therapy, happy to refer her as first step  We also discussed f/u - she opted PRN;  if sx continue or do not improve or has new vocal or swallowing sx or ear pain, or other issues, advised to call us  back  See below regarding exact medications prescribed this encounter including dosages and route: Meds ordered this encounter  Medications   fluticasone  (FLONASE ) 50 MCG/ACT nasal spray    Sig: Place 2 sprays into both nostrils daily.    Dispense:  16 g    Refill:  6   azelastine  (ASTELIN ) 0.1 % nasal spray    Sig: Place 2 sprays into both nostrils daily. Use in each nostril as directed    Dispense:  30 mL    Refill:  12      Thank you for allowing me the opportunity to care for your patient. Please do not hesitate to  contact me should you have any other questions.  Sincerely, Milon Aloe, MD Otolaryngologist (ENT), [facility] Phone: [phone] Fax: [phone]  05/16/2024, 5:01 PM   I have personally spent 46 minutes involved in face-to-face and non-face-to-face activities for this patient on the day of the visit.  Professional time spent excludes any procedures performed but includes the following activities, in addition to those noted in the documentation: preparing to see the patient (review of outside documentation and results), performing a medically appropriate examination, extensive counseling, ordering medications (flonase , astelin ), documenting in the electronic health record

## 2024-05-11 NOTE — Patient Instructions (Signed)
 Use two sprays of flonase in each nostril twice per day right after, use astelin spray two sprays each nostril twice per day

## 2024-06-14 ENCOUNTER — Other Ambulatory Visit: Payer: Self-pay | Admitting: Cardiovascular Disease

## 2024-07-16 NOTE — Telephone Encounter (Signed)
 Patient has not been seen this year nor does she have any future appointments with us . Will hold off re-running patient until she is seen by us  again or calls asking for medication.

## 2024-10-19 ENCOUNTER — Telehealth: Payer: Self-pay | Admitting: Cardiovascular Disease

## 2024-10-19 NOTE — Telephone Encounter (Signed)
 She reports that she has been feeling more tired lately. Like she needs to take a deeper breath/harder to take a deep breath at times. She was wondering if she could be seen soon after she gets her Echo performed on 11/05/24.  Informed her that Dr C has just added a few days= 12/11 and 12/12, and as soon as they open up, I will call her get her scheduled for one of those days.  She was very happy about this. Informed her that if she has not heard back from me in a week, then to please call or message us . She verbalized understanding.

## 2024-10-19 NOTE — Telephone Encounter (Signed)
 Pt called in stating she been feeling fatigue. Requested to make an appt with Dr. Francyne only. Please advise. She denies any other symptoms

## 2024-10-20 NOTE — Telephone Encounter (Signed)
 Left message and call back number stating that appts have opened for 11/11/24. Went ahead and scheduled her for 11/11/24 at 2:40. Call or message if that time does not work.

## 2024-10-21 NOTE — Telephone Encounter (Signed)
 Pt returned call and stated 12/11 at 2:40pm works for her.

## 2024-11-05 ENCOUNTER — Ambulatory Visit (HOSPITAL_COMMUNITY)
Admission: RE | Admit: 2024-11-05 | Discharge: 2024-11-05 | Disposition: A | Source: Ambulatory Visit | Attending: Cardiovascular Disease | Admitting: Cardiovascular Disease

## 2024-11-05 ENCOUNTER — Ambulatory Visit: Payer: Self-pay | Admitting: Cardiovascular Disease

## 2024-11-05 DIAGNOSIS — I350 Nonrheumatic aortic (valve) stenosis: Secondary | ICD-10-CM

## 2024-11-05 LAB — ECHOCARDIOGRAM COMPLETE
AR max vel: 0.75 cm2
AV Area VTI: 0.68 cm2
AV Area mean vel: 0.83 cm2
AV Mean grad: 20.6 mmHg
AV Peak grad: 31.4 mmHg
Ao pk vel: 2.8 m/s
Area-P 1/2: 2.36 cm2
MV M vel: 2.53 m/s
MV Peak grad: 25.6 mmHg
S' Lateral: 2 cm

## 2024-11-11 ENCOUNTER — Encounter: Payer: Self-pay | Admitting: Cardiovascular Disease

## 2024-11-11 ENCOUNTER — Ambulatory Visit: Attending: Cardiovascular Disease | Admitting: Cardiovascular Disease

## 2024-11-11 VITALS — BP 141/72 | HR 77 | Ht 60.0 in | Wt 163.6 lb

## 2024-11-11 DIAGNOSIS — I10 Essential (primary) hypertension: Secondary | ICD-10-CM

## 2024-11-11 DIAGNOSIS — I350 Nonrheumatic aortic (valve) stenosis: Secondary | ICD-10-CM | POA: Diagnosis not present

## 2024-11-11 DIAGNOSIS — E782 Mixed hyperlipidemia: Secondary | ICD-10-CM | POA: Diagnosis not present

## 2024-11-11 DIAGNOSIS — R0602 Shortness of breath: Secondary | ICD-10-CM

## 2024-11-11 NOTE — Progress Notes (Unsigned)
 Cardiology Office Note:    Date:  11/13/2024   ID:  Shelley [name], DOB May 20, 1932, MRN [id number]  PCP:  Dwight Trula SQUIBB, MD  Cardiologist:  Jerel Balding, MD   Referring MD: Dwight Trula SQUIBB, MD   Chief Complaint  Patient presents with   Cardiac Valve Problem    History of Present Illness:    Shelley [name] is a 89 y.o. female with a hx of moderate aortic stenosis, hypothyroidism, hypertension and hyperlipidemia.  She always accompanied her late husband, Sam to his office appointments; he passed away about 7 years ago.   Generally doing quite well.  Continues to live independently and has a very active social life.  She complains of easy fatigue, but stays very busy meeting her friends and staying in touch by phone and social media.  She has occasional orthostatic dizziness but has not had any falls.  She denies exertional dyspnea, just gets tired.  Has not had any angina pectoris.  Blood pressure is high in the office today which we have noticed before, but usually her blood pressure is in the 130s/80s.  In fact her dose of olmesartan had to be reduced at 1 point.  Also taking low doses of carvedilol and hydralazine.  In the past amlodipine caused severe lower extremity edema and hydrochlorothiazide  had to be stopped due to muscle cramps and lab abnormalities.    Her echocardiograms show very slow progression of moderate aortic stenosis.  The most recent study from 11/05/2024 shows a mean aortic valve gradient of 25 mmHg, with a relatively low stroke-volume index.  The dimensionless index is 0.30.  Based on my review of both the aortic valve area and the stroke-volume index are slightly underestimated due to under measurement of the LVOT diameter.   A few years ago she had a fall with a pattern suggesting orthostatic hypotension, may be related to relative hypovolemia.  She had a bad leg cramp and try to get out of bed to walk it off after which she fell.  It sounds  like she briefly lost consciousness.  CT head was okay.  She was found to have abnormalities in her electrolytes and hydrochlorothiazide  was discontinued.         Past Medical History:  Diagnosis Date   Hyperlipidemia    Hypertension    Thyroid  disease     No past surgical history on file.  Current Medications: Current Meds  Medication Sig   azelastine  (ASTELIN ) 0.1 % nasal spray Place 2 sprays into both nostrils daily. Use in each nostril as directed   calcium  carbonate (OS-CAL) 600 MG TABS tablet Take 600 mg by mouth daily with breakfast.   carvedilol (COREG) 3.125 MG tablet Take 3.125 mg by mouth 2 (two) times daily.   Cholecalciferol 25 MCG (1000 UT) capsule Take 1,000 Units by mouth daily. (Patient taking differently: Take 2,000 Units by mouth daily.)   cyanocobalamin (VITAMIN B12) 1000 MCG tablet Take 1,000 mcg by mouth daily.   fluticasone  (FLONASE ) 50 MCG/ACT nasal spray Place 2 sprays into both nostrils daily.   hydrALAZINE (APRESOLINE) 25 MG tablet Take 25 mg by mouth 3 (three) times daily.   levothyroxine (SYNTHROID) 50 MCG tablet Take 50 mcg by mouth every other day.   levothyroxine (SYNTHROID, LEVOTHROID) 75 MCG tablet Take 75 mcg by mouth every other day.   MAGNESIUM GLYCINATE PO Take 200 mg by mouth daily at 6 (six) AM.   melatonin 1 MG TABS tablet Take 1 mg by  mouth at bedtime.   olmesartan (BENICAR) 20 MG tablet Take 20 mg by mouth daily.   omeprazole (PRILOSEC) 40 MG capsule Take 40 mg by mouth as needed. (Patient taking differently: Take 40 mg by mouth every other day.)   rosuvastatin  (CRESTOR ) 20 MG tablet TAKE 1 TABLET DAILY (REPLACES SIMVASTATIN)   traZODone (DESYREL) 50 MG tablet Take 50 mg by mouth at bedtime.   Turmeric 500 MG TABS Take 1,000 mg by mouth daily at 6 (six) AM.     Allergies:   Amlodipine besylate, Doxycycline hyclate, and Hydrochlorothiazide   Family History: Negative for early onset coronary or vascular disease  EKGs/Labs/Other Studies  Reviewed:    The following studies were reviewed today: Echocardiogram 11/06/19/2025   1. Left ventricular ejection fraction, by estimation, is 65 to 70%. The left ventricle has normal function. The left ventricle has no regional wall motion abnormalities. There is moderate left ventricular hypertrophy. Left ventricular diastolic parameters are consistent with Grade I diastolic dysfunction (impaired relaxation).  2. Right ventricular systolic function is normal. The right ventricular size is normal. There is normal pulmonary artery systolic pressure.  3. Left atrial size was mildly dilated.  4. The mitral valve is normal in structure. Trivial mitral valve regurgitation. No evidence of mitral stenosis. Moderate mitral annular calcification.  5. The aortic valve is calcified. Aortic valve regurgitation is not visualized. Moderate aortic valve stenosis. Aortic valve area, by VTI measures 0.68 cm. Aortic valve mean gradient measures 20.6 mmHg. Aortic valve Vmax measures 2.80 m/s.  6. The inferior vena cava is normal in size with greater than 50% respiratory variability, suggesting right atrial pressure of 3 mmHg.  Comparison(s): A prior study was performed on 11/12/2023. There was moderate aortic stenosis with an AVA of 1.1 cm, Vmax 3.2 cm/s, mean gradient 25.0 mmHg and a SVI 47 compared to SVI 27 today.    LEFT VENTRICLE  LVOT diam:     1.69 cm LV SV:         46 LV SV Index:   27 LVOT Area:     2.24 cm     AORTIC VALVE AV Area (Vmax):    0.75 cm AV Area (Vmean):   0.83 cm AV Area (VTI):     0.68 cm AV Vmax:           280.00 cm/s AV Vmean:          164.600 cm/s AV VTI:            0.680 m AV Peak Grad:      31.4 mmHg AV Mean Grad:      20.6 mmHg LVOT Vmax:         93.40 cm/s LVOT Vmean:        60.800 cm/s LVOT VTI:          0.205 m LVOT/AV VTI ratio: 0.30         EKG:    EKG Interpretation Date/Time:  Thursday November 11 2024 15:14:56 EST Ventricular Rate:   77 PR Interval:  198 QRS Duration:  98 QT Interval:  410 QTC Calculation: 463 R Axis:   0  Text Interpretation: Normal sinus rhythm Possible Left atrial enlargement Left ventricular hypertrophy ( R in aVL , Cornell product )   When compared with ECG of 08-Mar-2024 10:47, No significant change was found  Confirmed by Miller Edgington ([id number]) on 11/11/2024 3:18:29 PM        Recent Labs: 11/09/2021 Hemoglobin 13.1, creatinine 0.6, potassium 4.6, ALT 14,  TSH 0.54  05/24/2022 Creatinine 1.02, potassium 3.9, ALT 13, TSH 0.96, hemoglobin 12.7 12/23/2023 Hemoglobin 13.7, potassium 4.5, ALT 10, TSH 1.380 Recent Lipid Panel 11/09/2021 Cholesterol 176, HDL 54, LDL 94, triglycerides 164  12/25/2022 Chol 176, HDL 54, LDL 90, TG 189  12/23/2023 Cholesterol 166, HDL 53, LDL 88, triglycerides 147   Physical Exam:    VS:  BP (!) 141/72   Pulse 77   Ht 5' (1.524 m)   Wt 163 lb 9.6 oz (74.2 kg)   LMP  (LMP Unknown)   SpO2 96%   BMI 31.95 kg/m     Wt Readings from Last 3 Encounters:  11/11/24 163 lb 9.6 oz (74.2 kg)  05/11/24 163 lb (73.9 kg)  03/08/24 161 lb (73 kg)     General: Alert, oriented x3, no distress, appears younger than stated age, mild obesity Head: no evidence of trauma, PERRL, EOMI, no exophtalmos or lid lag, no myxedema, no xanthelasma; normal ears, nose and oropharynx Neck: normal jugular venous pulsations and no hepatojugular reflux; brisk carotid pulses without delay and carotid bruits radiating from chest Chest: clear to auscultation, no signs of consolidation by percussion or palpation, normal fremitus, symmetrical and full respiratory excursions Cardiovascular: normal position and quality of the apical impulse, regular rhythm, normal first and distinct second heart sounds, mid peaking 3/6 aortic ejection murmur, no diastolic murmurs, rubs or gallops Abdomen: no tenderness or distention, no masses by palpation, no abnormal pulsatility or arterial bruits,  normal bowel sounds, no hepatosplenomegaly Extremities: no clubbing, cyanosis or edema; 2+ radial, ulnar and brachial pulses bilaterally; 2+ right femoral, posterior tibial and dorsalis pedis pulses; 2+ left femoral, posterior tibial and dorsalis pedis pulses; no subclavian or femoral bruits Neurological: grossly nonfocal Psych: Normal mood and affect       ASSESSMENT:    1. Nonrheumatic aortic (valve) stenosis   2. Essential hypertension   3. Mixed hyperlipidemia      PLAN:    In order of problems listed above:  HTN: She has a component of situational hypertension.  Adequate control on olmesartan 20 mg daily plus carvedilol 3.125 mg twice daily and hydralazine 25 mg 3 times daily.  Continue. AS: Continues with slow worsening of aortic stenosis, but this is probably still moderate.  She has fatigue but does not have frank dyspnea/angina/dizziness or syncope with exertion.  Continue to monitor with yearly echocardiograms.  Advise us  if she develops any of the symptoms mentioned above, with physical activity.  Encouraged her to stay physically active. HLP: She does not have known CAD or PAD.  Continue the current statin with a target LDL less than 100.    Medication Adjustments/Labs and Tests Ordered: Current medicines are reviewed at length with the patient today.  Concerns regarding medicines are outlined above.  Orders Placed This Encounter  Procedures   EKG 12-Lead   ECHOCARDIOGRAM COMPLETE   No orders of the defined types were placed in this encounter.   Patient Instructions  Medication Instructions:  No changes *If you need a refill on your cardiac medications before your next appointment, please call your pharmacy*  Lab Work: No changes If you have labs (blood work) drawn today and your tests are completely normal, you will receive your results only by: MyChart Message (if you have MyChart) OR A paper copy in the mail If you have any lab test that is abnormal or  we need to change your treatment, we will call you to review the results.  Testing/Procedures: Your  physician has requested that you have an echocardiogram in one year. Echocardiography is a painless test that uses sound waves to create images of your heart. It provides your doctor with information about the size and shape of your heart and how well your hearts chambers and valves are working. This procedure takes approximately one hour. There are no restrictions for this procedure. Please do NOT wear cologne, perfume, aftershave, or lotions (deodorant is allowed). Please arrive 15 minutes prior to your appointment time.  Please note: We ask at that you not bring children with you during ultrasound (echo/ vascular) testing. Due to room size and safety concerns, children are not allowed in the ultrasound rooms during exams. Our front office staff cannot provide observation of children in our lobby area while testing is being conducted. An adult accompanying a patient to their appointment will only be allowed in the ultrasound room at the discretion of the ultrasound technician under special circumstances. We apologize for any inconvenience.   Follow-Up: At [facility], you and your health needs are our priority.  As part of our continuing mission to provide you with exceptional heart care, our providers are all part of one team.  This team includes your primary Cardiologist (physician) and Advanced Practice Providers or APPs (Physician Assistants and Nurse Practitioners) who all work together to provide you with the care you need, when you need it.  Your next appointment:    1 yr- (after Echo)  Provider:   Jerel Balding, MD    We recommend signing up for the patient portal called MyChart.  Sign up information is provided on this After Visit Summary.  MyChart is used to connect with patients for Virtual Visits (Telemedicine).  Patients are able to view lab/test results, encounter notes,  upcoming appointments, etc.  Non-urgent messages can be sent to your provider as well.   To learn more about what you can do with MyChart, go to [email or web address].      Signed, Jerel Balding, MD  11/13/2024 10:12 AM    [facility]

## 2024-11-11 NOTE — Patient Instructions (Signed)
 Medication Instructions:  No changes *If you need a refill on your cardiac medications before your next appointment, please call your pharmacy*  Lab Work: No changes If you have labs (blood work) drawn today and your tests are completely normal, you will receive your results only by: MyChart Message (if you have MyChart) OR A paper copy in the mail If you have any lab test that is abnormal or we need to change your treatment, we will call you to review the results.  Testing/Procedures: Your physician has requested that you have an echocardiogram in one year. Echocardiography is a painless test that uses sound waves to create images of your heart. It provides your doctor with information about the size and shape of your heart and how well your hearts chambers and valves are working. This procedure takes approximately one hour. There are no restrictions for this procedure. Please do NOT wear cologne, perfume, aftershave, or lotions (deodorant is allowed). Please arrive 15 minutes prior to your appointment time.  Please note: We ask at that you not bring children with you during ultrasound (echo/ vascular) testing. Due to room size and safety concerns, children are not allowed in the ultrasound rooms during exams. Our front office staff cannot provide observation of children in our lobby area while testing is being conducted. An adult accompanying a patient to their appointment will only be allowed in the ultrasound room at the discretion of the ultrasound technician under special circumstances. We apologize for any inconvenience.   Follow-Up: At [facility], you and your health needs are our priority.  As part of our continuing mission to provide you with exceptional heart care, our providers are all part of one team.  This team includes your primary Cardiologist (physician) and Advanced Practice Providers or APPs (Physician Assistants and Nurse Practitioners) who all work together to  provide you with the care you need, when you need it.  Your next appointment:    1 yr- (after Echo)  Provider:   Jerel Balding, MD    We recommend signing up for the patient portal called MyChart.  Sign up information is provided on this After Visit Summary.  MyChart is used to connect with patients for Virtual Visits (Telemedicine).  Patients are able to view lab/test results, encounter notes, upcoming appointments, etc.  Non-urgent messages can be sent to your provider as well.   To learn more about what you can do with MyChart, go to [email or web address].

## 2024-11-13 DIAGNOSIS — I350 Nonrheumatic aortic (valve) stenosis: Secondary | ICD-10-CM | POA: Insufficient documentation

## 2024-11-13 DIAGNOSIS — E782 Mixed hyperlipidemia: Secondary | ICD-10-CM | POA: Insufficient documentation

## 2025-11-11 ENCOUNTER — Other Ambulatory Visit (HOSPITAL_BASED_OUTPATIENT_CLINIC_OR_DEPARTMENT_OTHER)
